# Patient Record
Sex: Male | Born: 1935 | Race: White | Hispanic: No | Marital: Married | Smoking: Never smoker
Health system: Southern US, Community
[De-identification: ages and names within clinical notes are randomized; demographics above are authoritative.]

## PROBLEM LIST (undated history)

## (undated) DIAGNOSIS — G629 Polyneuropathy, unspecified: Secondary | ICD-10-CM

## (undated) DIAGNOSIS — E559 Vitamin D deficiency, unspecified: Secondary | ICD-10-CM

## (undated) DIAGNOSIS — H8110 Benign paroxysmal vertigo, unspecified ear: Secondary | ICD-10-CM

## (undated) DIAGNOSIS — I779 Disorder of arteries and arterioles, unspecified: Secondary | ICD-10-CM

## (undated) DIAGNOSIS — I4891 Unspecified atrial fibrillation: Secondary | ICD-10-CM

## (undated) DIAGNOSIS — F329 Major depressive disorder, single episode, unspecified: Secondary | ICD-10-CM

## (undated) DIAGNOSIS — G459 Transient cerebral ischemic attack, unspecified: Secondary | ICD-10-CM

## (undated) DIAGNOSIS — F32A Depression, unspecified: Secondary | ICD-10-CM

## (undated) DIAGNOSIS — I739 Peripheral vascular disease, unspecified: Secondary | ICD-10-CM

## (undated) DIAGNOSIS — K760 Fatty (change of) liver, not elsewhere classified: Secondary | ICD-10-CM

## (undated) DIAGNOSIS — M810 Age-related osteoporosis without current pathological fracture: Secondary | ICD-10-CM

## (undated) HISTORY — DX: Major depressive disorder, single episode, unspecified: F32.9

## (undated) HISTORY — DX: Depression, unspecified: F32.A

## (undated) HISTORY — DX: Peripheral vascular disease, unspecified: I73.9

## (undated) HISTORY — DX: Polyneuropathy, unspecified: G62.9

## (undated) HISTORY — DX: Vitamin D deficiency, unspecified: E55.9

## (undated) HISTORY — DX: Age-related osteoporosis without current pathological fracture: M81.0

## (undated) HISTORY — PX: COLONOSCOPY: SHX174

## (undated) HISTORY — DX: Disorder of arteries and arterioles, unspecified: I77.9

## (undated) HISTORY — DX: Benign paroxysmal vertigo, unspecified ear: H81.10

## (undated) HISTORY — DX: Gilbert syndrome: E80.4

## (undated) HISTORY — DX: Unspecified atrial fibrillation: I48.91

## (undated) HISTORY — DX: Fatty (change of) liver, not elsewhere classified: K76.0

## (undated) SURGERY — Surgical Case
Anesthesia: *Unknown

---

## 2005-01-13 ENCOUNTER — Encounter: Admission: RE | Admit: 2005-01-13 | Discharge: 2005-01-13 | Payer: Self-pay | Admitting: Otolaryngology

## 2006-01-22 ENCOUNTER — Encounter: Admission: RE | Admit: 2006-01-22 | Discharge: 2006-01-22 | Payer: Self-pay | Admitting: Geriatric Medicine

## 2006-09-19 ENCOUNTER — Encounter: Payer: Self-pay | Admitting: Unknown Physician Specialty

## 2006-09-22 ENCOUNTER — Encounter: Payer: Self-pay | Admitting: Unknown Physician Specialty

## 2006-10-22 ENCOUNTER — Encounter: Payer: Self-pay | Admitting: Unknown Physician Specialty

## 2007-01-22 ENCOUNTER — Encounter: Admission: RE | Admit: 2007-01-22 | Discharge: 2007-01-22 | Payer: Self-pay | Admitting: Geriatric Medicine

## 2008-07-13 ENCOUNTER — Encounter: Admission: RE | Admit: 2008-07-13 | Discharge: 2008-07-13 | Payer: Self-pay | Admitting: Geriatric Medicine

## 2008-07-20 ENCOUNTER — Encounter: Admission: RE | Admit: 2008-07-20 | Discharge: 2008-07-20 | Payer: Self-pay | Admitting: Geriatric Medicine

## 2010-05-14 ENCOUNTER — Encounter: Payer: Self-pay | Admitting: Gastroenterology

## 2010-05-18 ENCOUNTER — Encounter
Admission: RE | Admit: 2010-05-18 | Discharge: 2010-05-18 | Payer: Self-pay | Source: Home / Self Care | Attending: Geriatric Medicine | Admitting: Geriatric Medicine

## 2010-09-19 ENCOUNTER — Emergency Department (HOSPITAL_COMMUNITY): Payer: Medicare Other

## 2010-09-19 ENCOUNTER — Emergency Department (HOSPITAL_COMMUNITY)
Admission: EM | Admit: 2010-09-19 | Discharge: 2010-09-19 | Disposition: A | Discharge status: Home / Self Care | Payer: Medicare Other | Attending: Emergency Medicine | Admitting: Emergency Medicine

## 2010-09-19 DIAGNOSIS — S0990XA Unspecified injury of head, initial encounter: Secondary | ICD-10-CM | POA: Insufficient documentation

## 2010-09-19 DIAGNOSIS — W19XXXA Unspecified fall, initial encounter: Secondary | ICD-10-CM | POA: Insufficient documentation

## 2010-09-19 DIAGNOSIS — Z7901 Long term (current) use of anticoagulants: Secondary | ICD-10-CM | POA: Insufficient documentation

## 2010-09-19 DIAGNOSIS — I6789 Other cerebrovascular disease: Secondary | ICD-10-CM | POA: Insufficient documentation

## 2010-09-19 DIAGNOSIS — IMO0002 Reserved for concepts with insufficient information to code with codable children: Secondary | ICD-10-CM | POA: Insufficient documentation

## 2010-09-19 DIAGNOSIS — I4891 Unspecified atrial fibrillation: Secondary | ICD-10-CM | POA: Insufficient documentation

## 2013-01-18 ENCOUNTER — Emergency Department (HOSPITAL_COMMUNITY)
Admission: EM | Admit: 2013-01-18 | Discharge: 2013-01-18 | Disposition: A | Discharge status: Home / Self Care | Payer: Medicare Other | Attending: Emergency Medicine | Admitting: Emergency Medicine

## 2013-01-18 DIAGNOSIS — R040 Epistaxis: Secondary | ICD-10-CM | POA: Insufficient documentation

## 2013-01-18 DIAGNOSIS — Z880 Allergy status to penicillin: Secondary | ICD-10-CM | POA: Insufficient documentation

## 2013-01-18 DIAGNOSIS — Z79899 Other long term (current) drug therapy: Secondary | ICD-10-CM | POA: Insufficient documentation

## 2013-01-18 DIAGNOSIS — Z7901 Long term (current) use of anticoagulants: Secondary | ICD-10-CM | POA: Insufficient documentation

## 2013-01-18 LAB — PROTIME-INR: INR: 2.67 — ABNORMAL HIGH (ref 0.00–1.49)

## 2013-01-18 MED ORDER — OXYMETAZOLINE HCL 0.05 % NA SOLN
1.0000 | Freq: Once | NASAL | Status: AC
  Administered 2013-01-18: 1 via NASAL
  Filled 2013-01-18: qty 15

## 2013-01-18 NOTE — ED Notes (Signed)
Nose bleed started at 640 pm  Pt is on coumadin,  Now coming out of mouth,  Blood pressure 200/100 initially

## 2013-01-18 NOTE — ED Notes (Signed)
Bed: ZO10 Expected date: 01/18/13 Expected time: 7:36 PM Means of arrival: Ambulance Comments: Bed 24, EMS, 32 M, Epistaxis

## 2013-01-18 NOTE — ED Notes (Signed)
Dr. Plunkett at bedside.  

## 2013-01-18 NOTE — ED Provider Notes (Addendum)
CSN: [id number]     Arrival date & time 01/18/13  1944 History   First MD Initiated Contact with Patient 01/18/13 2004     Chief Complaint  Patient presents with  . Epistaxis   (Consider location/radiation/quality/duration/timing/severity/associated sxs/prior Treatment) Patient is a 79 y.o. male presenting with nosebleeds. The history is provided by the patient.  Epistaxis Location:  L nare Severity:  Severe Duration:  2 hours Timing:  Constant Progression:  Unchanged Chronicity:  New Context: anticoagulants   Context: not foreign body, not nose picking, not recent infection and not trauma   Relieved by:  Nothing Worsened by:  Nothing tried Ineffective treatments:  Applying pressure Associated symptoms: blood in oropharynx   Associated symptoms: no cough, no dizziness, no fever and no headaches   Risk factors: no change in medication and no frequent nosebleeds     No past medical history on file. No past surgical history on file. No family history on file. History  Substance Use Topics  . Smoking status: Not on file  . Smokeless tobacco: Not on file  . Alcohol Use: Not on file    Review of Systems  Constitutional: Negative for fever.  HENT: Positive for nosebleeds.   Respiratory: Negative for cough.   Neurological: Negative for dizziness and headaches.  All other systems reviewed and are negative.    Allergies  Penicillin g benzathine; Scopolamine base; and Sulfa antibiotics  Home Medications   Current Outpatient Rx  Name  Route  Sig  Dispense  Refill  . cholecalciferol (VITAMIN D) 1000 UNITS tablet   Oral   Take 1,000 Units by mouth daily.         Marland Kitchen co-enzyme Q-10 50 MG capsule   Oral   Take 100 mg by mouth daily.         Marland Kitchen donepezil (ARICEPT) 10 MG tablet   Oral   Take 10 mg by mouth at bedtime.         . dutasteride (AVODART) 0.5 MG capsule   Oral   Take 0.5 mg by mouth at bedtime.          . fish oil-omega-3 fatty acids 1000 MG capsule   Oral   Take 1 g by mouth 2 (two) times daily.         Marland Kitchen L-Lysine 500 MG TABS   Oral   Take 500 mg by mouth daily.         . memantine (NAMENDA) 10 MG tablet   Oral   Take 10 mg by mouth 2 (two) times daily.         . Multiple Vitamin (MULTIVITAMIN WITH MINERALS) TABS tablet   Oral   Take 1 tablet by mouth daily.         . sertraline (ZOLOFT) 25 MG tablet   Oral   Take 25 mg by mouth daily.         . sildenafil (VIAGRA) 50 MG tablet   Oral   Take 50 mg by mouth daily as needed for erectile dysfunction.         . tamsulosin (FLOMAX) 0.4 MG CAPS capsule   Oral   Take 0.4 mg by mouth at bedtime.         Marland Kitchen warfarin (COUMADIN) 3 MG tablet   Oral   Take 3 mg by mouth every evening.          BP 173/60  Pulse 64  Resp 15  SpO2 100% Physical Exam  Nursing note and  vitals reviewed. Constitutional: He is oriented to person, place, and time. He appears well-developed and well-nourished. No distress.  HENT:  Head: Normocephalic and atraumatic.  Nose: Epistaxis is observed.  Active bleeding out of the left nare.  Blood in the posterior pharynx  Eyes: EOM are normal. Pupils are equal, round, and reactive to light.  Cardiovascular: Normal rate, regular rhythm and intact distal pulses.   Pulmonary/Chest: Effort normal.  Neurological: He is alert and oriented to person, place, and time.  Skin: Skin is warm and dry.  Psychiatric: He has a normal mood and affect. His behavior is normal.    ED Course  EPISTAXIS MANAGEMENT Date/Time: 01/18/2013 8:24 PM Performed by: Gwyneth Sprout Authorized by: Gwyneth Sprout Consent: Verbal consent obtained. Consent given by: patient and spouse Patient identity confirmed: verbally with patient Patient sedated: no Treatment site: left anterior Repair method: suction and anterior pack Post-procedure assessment: bleeding stopped Treatment complexity: simple Patient tolerance: Patient tolerated the procedure well with no  immediate complications. Comments: Bleeding resolved.   (including critical care time) Labs Review Labs Reviewed  PROTIME-INR - Abnormal; Notable for the following:    Prothrombin Time 27.5 (*)    INR 2.67 (*)    All other components within normal limits   Imaging Review No results found.  MDM   1. Left-sided epistaxis     Patient presenting with epistaxis out of the left near. Appears to be an anterior nosebleed. Coumadin was last checked several weeks ago and was okay.  Patient had anterior packing placed with resolution of the nosebleed. He is otherwise well-appearing and INR is pending.  8:52 PM INR is therapeutic and will d/c home with packing nad f/u in 3 days.  Gwyneth Sprout, MD 01/18/13 1610  Gwyneth Sprout, MD 01/18/13 [phone]

## 2013-07-30 ENCOUNTER — Ambulatory Visit
Admission: RE | Admit: 2013-07-30 | Discharge: 2013-07-30 | Disposition: A | Discharge status: Home / Self Care | Payer: Medicare Other | Source: Ambulatory Visit | Attending: Geriatric Medicine | Admitting: Geriatric Medicine

## 2013-07-30 ENCOUNTER — Other Ambulatory Visit: Payer: Self-pay | Admitting: Geriatric Medicine

## 2013-07-30 DIAGNOSIS — R062 Wheezing: Secondary | ICD-10-CM

## 2013-08-24 ENCOUNTER — Other Ambulatory Visit: Payer: Self-pay | Admitting: Geriatric Medicine

## 2013-08-24 ENCOUNTER — Ambulatory Visit
Admission: RE | Admit: 2013-08-24 | Discharge: 2013-08-24 | Disposition: A | Discharge status: Home / Self Care | Payer: Medicare Other | Source: Ambulatory Visit | Attending: Geriatric Medicine | Admitting: Geriatric Medicine

## 2013-08-24 DIAGNOSIS — J90 Pleural effusion, not elsewhere classified: Secondary | ICD-10-CM

## 2013-11-18 ENCOUNTER — Encounter: Payer: Self-pay | Admitting: *Deleted

## 2013-12-25 ENCOUNTER — Ambulatory Visit
Admission: RE | Admit: 2013-12-25 | Discharge: 2013-12-25 | Disposition: A | Discharge status: Home / Self Care | Payer: Medicare Other | Source: Ambulatory Visit | Attending: Geriatric Medicine | Admitting: Geriatric Medicine

## 2013-12-25 ENCOUNTER — Other Ambulatory Visit: Payer: Self-pay | Admitting: Geriatric Medicine

## 2013-12-25 DIAGNOSIS — R059 Cough, unspecified: Secondary | ICD-10-CM

## 2013-12-25 DIAGNOSIS — R05 Cough: Secondary | ICD-10-CM

## 2014-01-20 ENCOUNTER — Other Ambulatory Visit: Payer: Self-pay | Admitting: Geriatric Medicine

## 2014-01-20 ENCOUNTER — Ambulatory Visit
Admission: RE | Admit: 2014-01-20 | Discharge: 2014-01-20 | Disposition: A | Discharge status: Home / Self Care | Payer: Medicare Other | Source: Ambulatory Visit | Attending: Geriatric Medicine | Admitting: Geriatric Medicine

## 2014-01-20 DIAGNOSIS — R059 Cough, unspecified: Secondary | ICD-10-CM

## 2014-01-20 DIAGNOSIS — R05 Cough: Secondary | ICD-10-CM

## 2014-08-11 ENCOUNTER — Emergency Department (HOSPITAL_COMMUNITY): Payer: Medicare Other

## 2014-08-11 ENCOUNTER — Inpatient Hospital Stay (HOSPITAL_COMMUNITY)
Admission: EM | Admit: 2014-08-11 | Discharge: 2014-08-16 | DRG: 178 | Disposition: A | Discharge status: Skilled Nursing Facility | Payer: Medicare Other | Attending: Internal Medicine | Admitting: Internal Medicine

## 2014-08-11 ENCOUNTER — Encounter (HOSPITAL_COMMUNITY): Payer: Self-pay | Admitting: Emergency Medicine

## 2014-08-11 DIAGNOSIS — I482 Chronic atrial fibrillation: Secondary | ICD-10-CM | POA: Diagnosis not present

## 2014-08-11 DIAGNOSIS — Z79899 Other long term (current) drug therapy: Secondary | ICD-10-CM | POA: Diagnosis not present

## 2014-08-11 DIAGNOSIS — I4891 Unspecified atrial fibrillation: Secondary | ICD-10-CM | POA: Diagnosis present

## 2014-08-11 DIAGNOSIS — I272 Other secondary pulmonary hypertension: Secondary | ICD-10-CM | POA: Diagnosis present

## 2014-08-11 DIAGNOSIS — R0609 Other forms of dyspnea: Secondary | ICD-10-CM | POA: Diagnosis present

## 2014-08-11 DIAGNOSIS — I739 Peripheral vascular disease, unspecified: Secondary | ICD-10-CM

## 2014-08-11 DIAGNOSIS — J90 Pleural effusion, not elsewhere classified: Secondary | ICD-10-CM | POA: Diagnosis present

## 2014-08-11 DIAGNOSIS — J189 Pneumonia, unspecified organism: Secondary | ICD-10-CM | POA: Diagnosis present

## 2014-08-11 DIAGNOSIS — R791 Abnormal coagulation profile: Secondary | ICD-10-CM | POA: Diagnosis not present

## 2014-08-11 DIAGNOSIS — J690 Pneumonitis due to inhalation of food and vomit: Principal | ICD-10-CM | POA: Diagnosis present

## 2014-08-11 DIAGNOSIS — E559 Vitamin D deficiency, unspecified: Secondary | ICD-10-CM | POA: Diagnosis present

## 2014-08-11 DIAGNOSIS — F0390 Unspecified dementia without behavioral disturbance: Secondary | ICD-10-CM | POA: Diagnosis present

## 2014-08-11 DIAGNOSIS — Z7901 Long term (current) use of anticoagulants: Secondary | ICD-10-CM

## 2014-08-11 DIAGNOSIS — I10 Essential (primary) hypertension: Secondary | ICD-10-CM | POA: Diagnosis present

## 2014-08-11 DIAGNOSIS — K760 Fatty (change of) liver, not elsewhere classified: Secondary | ICD-10-CM | POA: Diagnosis present

## 2014-08-11 DIAGNOSIS — Z8673 Personal history of transient ischemic attack (TIA), and cerebral infarction without residual deficits: Secondary | ICD-10-CM

## 2014-08-11 DIAGNOSIS — Z66 Do not resuscitate: Secondary | ICD-10-CM | POA: Diagnosis present

## 2014-08-11 DIAGNOSIS — G459 Transient cerebral ischemic attack, unspecified: Secondary | ICD-10-CM

## 2014-08-11 DIAGNOSIS — R748 Abnormal levels of other serum enzymes: Secondary | ICD-10-CM

## 2014-08-11 DIAGNOSIS — I509 Heart failure, unspecified: Secondary | ICD-10-CM | POA: Diagnosis present

## 2014-08-11 DIAGNOSIS — F329 Major depressive disorder, single episode, unspecified: Secondary | ICD-10-CM | POA: Diagnosis present

## 2014-08-11 DIAGNOSIS — Z880 Allergy status to penicillin: Secondary | ICD-10-CM | POA: Diagnosis not present

## 2014-08-11 DIAGNOSIS — R1310 Dysphagia, unspecified: Secondary | ICD-10-CM | POA: Diagnosis present

## 2014-08-11 DIAGNOSIS — H8110 Benign paroxysmal vertigo, unspecified ear: Secondary | ICD-10-CM | POA: Diagnosis present

## 2014-08-11 DIAGNOSIS — R7989 Other specified abnormal findings of blood chemistry: Secondary | ICD-10-CM

## 2014-08-11 DIAGNOSIS — Z9889 Other specified postprocedural states: Secondary | ICD-10-CM

## 2014-08-11 DIAGNOSIS — I779 Disorder of arteries and arterioles, unspecified: Secondary | ICD-10-CM | POA: Diagnosis present

## 2014-08-11 DIAGNOSIS — G629 Polyneuropathy, unspecified: Secondary | ICD-10-CM | POA: Diagnosis present

## 2014-08-11 DIAGNOSIS — R0602 Shortness of breath: Secondary | ICD-10-CM | POA: Diagnosis present

## 2014-08-11 HISTORY — DX: Transient cerebral ischemic attack, unspecified: G45.9

## 2014-08-11 LAB — CBC WITH DIFFERENTIAL/PLATELET
BASOS ABS: 0 10*3/uL (ref 0.0–0.1)
BASOS PCT: 0 % (ref 0–1)
EOS PCT: 3 % (ref 0–5)
Eosinophils Absolute: 0.2 10*3/uL (ref 0.0–0.7)
HEMATOCRIT: 38.1 % — AB (ref 39.0–52.0)
HEMOGLOBIN: 12.5 g/dL — AB (ref 13.0–17.0)
Lymphocytes Relative: 11 % — ABNORMAL LOW (ref 12–46)
Lymphs Abs: 0.8 10*3/uL (ref 0.7–4.0)
MCH: 32.9 pg (ref 26.0–34.0)
MCHC: 32.8 g/dL (ref 30.0–36.0)
MCV: 100.3 fL — AB (ref 78.0–100.0)
MONO ABS: 0.6 10*3/uL (ref 0.1–1.0)
Monocytes Relative: 10 % (ref 3–12)
Neutro Abs: 5.1 10*3/uL (ref 1.7–7.7)
Neutrophils Relative %: 76 % (ref 43–77)
Platelets: 243 10*3/uL (ref 150–400)
RBC: 3.8 MIL/uL — ABNORMAL LOW (ref 4.22–5.81)
RDW: 14 % (ref 11.5–15.5)
WBC: 6.7 10*3/uL (ref 4.0–10.5)

## 2014-08-11 LAB — URINALYSIS, ROUTINE W REFLEX MICROSCOPIC
Bilirubin Urine: NEGATIVE
Bilirubin Urine: NEGATIVE
GLUCOSE, UA: NEGATIVE mg/dL
Glucose, UA: NEGATIVE mg/dL
KETONES UR: NEGATIVE mg/dL
Ketones, ur: NEGATIVE mg/dL
LEUKOCYTES UA: NEGATIVE
Leukocytes, UA: NEGATIVE
Nitrite: NEGATIVE
Nitrite: NEGATIVE
PH: 7 (ref 5.0–8.0)
PH: 7.5 (ref 5.0–8.0)
PROTEIN: NEGATIVE mg/dL
Protein, ur: NEGATIVE mg/dL
SPECIFIC GRAVITY, URINE: 1.008 (ref 1.005–1.030)
Specific Gravity, Urine: 1.014 (ref 1.005–1.030)
Urobilinogen, UA: 1 mg/dL (ref 0.0–1.0)
Urobilinogen, UA: 1 mg/dL (ref 0.0–1.0)

## 2014-08-11 LAB — URINE MICROSCOPIC-ADD ON

## 2014-08-11 LAB — COMPREHENSIVE METABOLIC PANEL
ALT: 24 U/L (ref 0–53)
AST: 28 U/L (ref 0–37)
Albumin: 2.8 g/dL — ABNORMAL LOW (ref 3.5–5.2)
Alkaline Phosphatase: 125 U/L — ABNORMAL HIGH (ref 39–117)
Anion gap: 7 (ref 5–15)
BUN: 27 mg/dL — ABNORMAL HIGH (ref 6–23)
CO2: 27 mmol/L (ref 19–32)
Calcium: 8.5 mg/dL (ref 8.4–10.5)
Chloride: 104 mmol/L (ref 96–112)
Creatinine, Ser: 1.43 mg/dL — ABNORMAL HIGH (ref 0.50–1.35)
GFR calc non Af Amer: 42 mL/min — ABNORMAL LOW (ref >90)
GFR, EST AFRICAN AMERICAN: 49 mL/min — AB (ref >90)
Glucose, Bld: 100 mg/dL — ABNORMAL HIGH (ref 70–99)
Potassium: 3.9 mmol/L (ref 3.5–5.1)
Sodium: 138 mmol/L (ref 135–145)
Total Bilirubin: 1.2 mg/dL (ref 0.3–1.2)
Total Protein: 7.2 g/dL (ref 6.0–8.3)

## 2014-08-11 LAB — PROTIME-INR
INR: 4.29 — ABNORMAL HIGH (ref 0.00–1.49)
PROTHROMBIN TIME: 41.5 s — AB (ref 11.6–15.2)

## 2014-08-11 LAB — I-STAT TROPONIN, ED: TROPONIN I, POC: 0.01 ng/mL (ref 0.00–0.08)

## 2014-08-11 LAB — BRAIN NATRIURETIC PEPTIDE: B NATRIURETIC PEPTIDE 5: 320.9 pg/mL — AB (ref 0.0–100.0)

## 2014-08-11 MED ORDER — DUTASTERIDE 0.5 MG PO CAPS
0.5000 mg | ORAL_CAPSULE | Freq: Every day | ORAL | Status: DC
  Administered 2014-08-11 – 2014-08-15 (×5): 0.5 mg via ORAL
  Filled 2014-08-11 (×6): qty 1

## 2014-08-11 MED ORDER — ACETAMINOPHEN 325 MG PO TABS: 650.0000 mg | ORAL_TABLET | Freq: Four times a day (QID) | ORAL | Status: DC | PRN

## 2014-08-11 MED ORDER — DOCUSATE SODIUM 50 MG/5ML PO LIQD
100.0000 mg | Freq: Two times a day (BID) | ORAL | Status: DC
  Administered 2014-08-11: 100 mg via ORAL
  Filled 2014-08-11 (×3): qty 10

## 2014-08-11 MED ORDER — SERTRALINE HCL 25 MG PO TABS
25.0000 mg | ORAL_TABLET | Freq: Every day | ORAL | Status: DC
  Administered 2014-08-12 – 2014-08-16 (×5): 25 mg via ORAL
  Filled 2014-08-11 (×5): qty 1

## 2014-08-11 MED ORDER — DONEPEZIL HCL 10 MG PO TABS
10.0000 mg | ORAL_TABLET | Freq: Every day | ORAL | Status: DC
  Administered 2014-08-11 – 2014-08-15 (×5): 10 mg via ORAL
  Filled 2014-08-11 (×6): qty 1

## 2014-08-11 MED ORDER — TAMSULOSIN HCL 0.4 MG PO CAPS
0.4000 mg | ORAL_CAPSULE | Freq: Every day | ORAL | Status: DC
  Administered 2014-08-11 – 2014-08-15 (×5): 0.4 mg via ORAL
  Filled 2014-08-11 (×6): qty 1

## 2014-08-11 MED ORDER — SODIUM CHLORIDE 0.9 % IV SOLN
INTRAVENOUS | Status: DC
  Administered 2014-08-11: 17:00:00 via INTRAVENOUS

## 2014-08-11 MED ORDER — FUROSEMIDE 40 MG PO TABS
40.0000 mg | ORAL_TABLET | Freq: Every day | ORAL | Status: DC
  Administered 2014-08-12 – 2014-08-16 (×5): 40 mg via ORAL
  Filled 2014-08-11 (×5): qty 1

## 2014-08-11 MED ORDER — SODIUM CHLORIDE 0.9 % IV SOLN
INTRAVENOUS | Status: AC
  Administered 2014-08-11: 17:00:00 via INTRAVENOUS
  Filled 2014-08-11: qty 1000

## 2014-08-11 MED ORDER — ENSURE ENLIVE PO LIQD
237.0000 mL | Freq: Two times a day (BID) | ORAL | Status: DC
Start: 2014-08-12 — End: 2014-08-16
  Administered 2014-08-12 – 2014-08-16 (×8): 237 mL via ORAL

## 2014-08-11 MED ORDER — MEMANTINE HCL 10 MG PO TABS
10.0000 mg | ORAL_TABLET | Freq: Two times a day (BID) | ORAL | Status: DC
  Administered 2014-08-11 – 2014-08-16 (×10): 10 mg via ORAL
  Filled 2014-08-11 (×11): qty 1

## 2014-08-11 MED ORDER — ONDANSETRON HCL 4 MG/2ML IJ SOLN: 4.0000 mg | Freq: Four times a day (QID) | INTRAMUSCULAR | Status: DC | PRN

## 2014-08-11 MED ORDER — SODIUM CHLORIDE 0.9 % IJ SOLN
3.0000 mL | Freq: Two times a day (BID) | INTRAMUSCULAR | Status: DC
  Administered 2014-08-12 – 2014-08-16 (×8): 3 mL via INTRAVENOUS

## 2014-08-11 NOTE — Progress Notes (Addendum)
Dinah BeersZeb N Bertucci [id number] Admission Data: 08/11/2014 4:50 PM Attending Provider: Marinda ElkAbraham Feliz Ortiz, MD  WUJ:[id number],NFAPCP:STONEKING,HAL Maisie FusHOMAS, MD Consults/ Treatment Team:    Dinah BeersZeb N Francis Thomas is a 81 y.o. male patient admitted from ED awake, alert  & orientated  To self,  DNR, VSS - Blood pressure 129/83, pulse 63, temperature 98.3 F (36.8 C), temperature source Oral, resp. rate 18, weight 66.769 kg (147 lb 3.2 oz), SpO2 99 %., no c/o chest pain, no distress noted. Tele #8placed and pt is currently running:   IV site WDL: Left arm, SL.  Allergies:   Allergies  Allergen Reactions  . Penicillin G Benzathine   . Scopolamine Base [Scopolamine]   . Skelaxin [Metaxalone]     Confusion  . Sulfa Antibiotics      Past Medical History  Diagnosis Date  . Atrial fibrillation   . Osteoporosis   . Benign positional vertigo   . Fatty liver   . Vitamin D deficiency   . Peripheral neuropathy   . Bilateral carotid artery disease   . Gilbert's syndrome     Mild  . Depression   . TIA (transient ischemic attack)      Pt orientation to unit, room and routine. Information packet given to patient/family and safety video watched.  Admission INP armband ID verified with patient/family, and in place. SR up x 2, fall risk assessment complete with Patient and family verbalizing understanding of risks associated with falls. Pt verbalizes an understanding of how to use the call bell and to call for help before getting out of bed.  Skin, tiny stage two noted to Right hip, scab noted to ankle area of right foot, abrasion/scab noted to Left shin. Scab on right ankle and stage II to hip covered with a foam dsg.     Will cont to monitor and assist as needed.  Kern ReapBrumagin, Tia Hieronymus L, RN 08/11/2014 4:50 PM

## 2014-08-11 NOTE — Progress Notes (Signed)
ANTICOAGULATION CONSULT NOTE - Initial Consult  Pharmacy Consult for warfarin Indication: atrial fibrillation  Allergies  Allergen Reactions  . Penicillin G Benzathine   . Scopolamine Base [Scopolamine]   . Skelaxin [Metaxalone]     Confusion  . Sulfa Antibiotics     Patient Measurements: Weight: 147 lb 3.2 oz (66.769 kg)  Vital Signs: Temp: 98.6 F (37 C) (04/20 1008) Temp Source: Oral (04/20 1008) BP: 162/65 mmHg (04/20 1515) Pulse Rate: 53 (04/20 1515)  Labs:  Recent Labs  08/11/14 1035  HGB 12.5*  HCT 38.1*  PLT 243  LABPROT 41.5*  INR 4.29*  CREATININE 1.43*    CrCl cannot be calculated (Unknown ideal weight.).   Medical History: Past Medical History  Diagnosis Date  . Atrial fibrillation   . Osteoporosis   . Benign positional vertigo   . Fatty liver   . Vitamin D deficiency   . Peripheral neuropathy   . Bilateral carotid artery disease   . Gilbert's syndrome     Mild  . Depression     Medications:  Prescriptions prior to admission  Medication Sig Dispense Refill Last Dose  . CALCIUM PO Take 1 tablet by mouth daily.   08/10/2014 at Unknown time  . cholecalciferol (VITAMIN D) 1000 UNITS tablet Take 1,000 Units by mouth daily.   08/10/2014 at Unknown time  . co-enzyme Q-10 50 MG capsule Take 100 mg by mouth daily.   08/10/2014 at Unknown time  . donepezil (ARICEPT) 10 MG tablet Take 10 mg by mouth at bedtime.   08/10/2014 at Unknown time  . dutasteride (AVODART) 0.5 MG capsule Take 0.5 mg by mouth at bedtime.    08/10/2014 at Unknown time  . furosemide (LASIX) 40 MG tablet Take 40 mg by mouth every morning.   08/11/2014 at Unknown time  . IRON PO Take 1 tablet by mouth daily.   08/10/2014 at Unknown time  . memantine (NAMENDA) 10 MG tablet Take 10 mg by mouth 2 (two) times daily.   08/10/2014 at Unknown time  . OVER THE COUNTER MEDICATION Place 1 application into both nostrils 3 (three) times daily as needed (nasal dripping). OTC nasal cream for nasal  dripping   Past Week at Unknown time  . sertraline (ZOLOFT) 25 MG tablet Take 25 mg by mouth daily.   08/11/2014 at Unknown time  . tamsulosin (FLOMAX) 0.4 MG CAPS capsule Take 0.4 mg by mouth at bedtime.   08/10/2014 at Unknown time  . warfarin (COUMADIN) 3 MG tablet Take 3 mg by mouth every evening.   08/10/2014    Assessment: 81 year old man on chronic warfarin for AFIB to continue warfarin while hospitalized.  INR today 4.29 which is above his goal of 2-3.  May need a procedure to drain a pleural effusion at some point. Goal of Therapy:  INR 2-3    Plan:  Hold warfarin  Daily protimes  Mickeal SkinnerFrens, Teairra Millar John 08/11/2014,4:26 PM

## 2014-08-11 NOTE — ED Notes (Signed)
MD at bedside. 

## 2014-08-11 NOTE — H&P (Signed)
[facility] History and Physical                                                                                    Francis Thomas, is a 81 y.o. male  MRN: [id number]   DOB - [birth year]  Admit Date - 08/11/2014  Outpatient Primary MD for the patient is Ginette Otto, MD  Referring Physician:  Dr. Deretha Emory, EDP  Chief Complaint:   Chief Complaint  Patient presents with  . Shortness of Breath     HPI  Francis Thomas  is a 81 y.o. male, with a past medical history significant for atrial fibrillation on chronic Coumadin, benign positional vertigo, bilateral carotid artery disease, and mild dementia. He presented to the emergency department with shortness of breath that has been become significantly worse over the last 24 hours. Mr. Matassa has been followed for a large right pleural effusion for at least the last 6 months. However, on presentation today x-ray shows significant enlargement. His wife reports that he is short of breath with any exertion and is unable to walk up stairs at home. This is abnormal for him as he is normally able angulate without difficulty. He does have a slight nonproductive cough that started in the last day or 2. His wife assumed his allergies. The patient denies any chest pain or palpitations. He has been afebrile. Approximately 2 weeks ago he had difficulty walking and a fall. He was seen by Dr. Merlene Laughter who felt he most likely had a TIA or small stroke.  In the emergency department chest x-ray shows a significantly enlarged right pleural effusion with deviated trachea and a question of an underlying mass. The patient's creatinine is mildly elevated at 1.43, BUN is 27. INR is 4.29.  Review of Systems   In addition to the HPI above,  No Fever-chills, No Headache, No changes with Vision or hearing, ++ Patient's wife describes that the patient has had difficulty swallowing lately. No Chest pain, orthopnea, PND No Abdominal pain, No Nausea or Vomiting, Bowel  movements are constipated No Blood in stool or Urine, + he is normally incontinent of urine, wife describes a strong odor to his urine and  No dysuria, No new skin rashes or bruises, No new joints pains-aches,  No new weakness, tingling, numbness in any extremity, No recent weight gain or loss, A full 10 point Review of Systems was done, except as stated above, all other Review of Systems were negative.  Past Medical History  Past Medical History  Diagnosis Date  . Atrial fibrillation   . Osteoporosis   . Benign positional vertigo   . Fatty liver   . Vitamin D deficiency   . Peripheral neuropathy   . Bilateral carotid artery disease   . Gilbert's syndrome     Mild  . Depression     History reviewed. No pertinent past surgical history.    Social History History  Substance Use Topics  . Smoking status: Never Smoker   . Smokeless tobacco: Not on file  . Alcohol Use: Yes     Comment: Occasional Glass of wine or beer    Family History No known family  history of lung cancer.  Mother died of heart disease.  Father died of an MI.  Prior to Admission medications   Medication Sig Start Date End Date Taking? Authorizing Provider  CALCIUM PO Take 1 tablet by mouth daily.   Yes Historical Provider, MD  cholecalciferol (VITAMIN D) 1000 UNITS tablet Take 1,000 Units by mouth daily.   Yes Historical Provider, MD  co-enzyme Q-10 50 MG capsule Take 100 mg by mouth daily.   Yes Historical Provider, MD  donepezil (ARICEPT) 10 MG tablet Take 10 mg by mouth at bedtime.   Yes Historical Provider, MD  dutasteride (AVODART) 0.5 MG capsule Take 0.5 mg by mouth at bedtime.    Yes Historical Provider, MD  furosemide (LASIX) 40 MG tablet Take 40 mg by mouth every morning.   Yes Historical Provider, MD  IRON PO Take 1 tablet by mouth daily.   Yes Historical Provider, MD  memantine (NAMENDA) 10 MG tablet Take 10 mg by mouth 2 (two) times daily.   Yes Historical Provider, MD  OVER THE COUNTER  MEDICATION Place 1 application into both nostrils 3 (three) times daily as needed (nasal dripping). OTC nasal cream for nasal dripping   Yes Historical Provider, MD  sertraline (ZOLOFT) 25 MG tablet Take 25 mg by mouth daily.   Yes Historical Provider, MD  tamsulosin (FLOMAX) 0.4 MG CAPS capsule Take 0.4 mg by mouth at bedtime.   Yes Historical Provider, MD  warfarin (COUMADIN) 3 MG tablet Take 3 mg by mouth every evening.   Yes Historical Provider, MD    Allergies  Allergen Reactions  . Penicillin G Benzathine   . Scopolamine Base [Scopolamine]   . Skelaxin [Metaxalone]     Confusion  . Sulfa Antibiotics     Physical Exam  Vitals  Blood pressure 162/65, pulse 53, temperature 98.6 F (37 C), temperature source Oral, resp. rate 14, weight 66.769 kg (147 lb 3.2 oz), SpO2 96 %.   General:  Wd, thin elderly male, pleasantly lying in bed in NAD, wife at bedside.  Psych:  Normal affect and insight, Not Suicidal or Homicidal, Awake, Alert, pleasantly demented.  Unable to give an accurate history.  Neuro:   Tongue deviates right, Strength 5/5 all 4 extremities, Sensation intact all 4 extremities.  ENT:  Ears and Eyes appear Normal, Conjunctivae clear, PER. Moist oral mucosa without erythema or exudates.  Neck:  Supple, No lymphadenopathy appreciated  Respiratory:  Poor air movement on the right.  No frank w/c/r  Cardiac:  irreg irreg, No Murmurs, no LE edema noted, no JVD.    Abdomen:  Positive bowel sounds, Soft, Non tender, Non distended,  No masses appreciated  Skin:  No Cyanosis, Normal Skin Turgor, No Skin Rash or Bruise.  Extremities:  Able to move all 4.   no effusions.  Data Review  CBC  Recent Labs Lab 08/11/14 1035  WBC 6.7  HGB 12.5*  HCT 38.1*  PLT 243  MCV 100.3*  MCH 32.9  MCHC 32.8  RDW 14.0  LYMPHSABS 0.8  MONOABS 0.6  EOSABS 0.2  BASOSABS 0.0    Chemistries   Recent Labs Lab 08/11/14 1035  NA 138  K 3.9  CL 104  CO2 27  GLUCOSE 100*   BUN 27*  CREATININE 1.43*  CALCIUM 8.5  AST 28  ALT 24  ALKPHOS 125*  BILITOT 1.2    Coagulation profile  Recent Labs Lab 08/11/14 1035  INR 4.29*    Urinalysis    Component  Value Date/Time   COLORURINE YELLOW 08/11/2014 1154   APPEARANCEUR CLEAR 08/11/2014 1154   LABSPEC 1.008 08/11/2014 1154   PHURINE 7.0 08/11/2014 1154   GLUCOSEU NEGATIVE 08/11/2014 1154   HGBUR TRACE* 08/11/2014 1154   BILIRUBINUR NEGATIVE 08/11/2014 1154   KETONESUR NEGATIVE 08/11/2014 1154   PROTEINUR NEGATIVE 08/11/2014 1154   UROBILINOGEN 1.0 08/11/2014 1154   NITRITE NEGATIVE 08/11/2014 1154   LEUKOCYTESUR NEGATIVE 08/11/2014 1154    Imaging results:   [name]  08/11/2014   CLINICAL DATA:  Progressive shortness of breath.  EXAM: PORTABLE CHEST - 1 VIEW  COMPARISON:  01/20/2014  FINDINGS: There is a large right pleural effusion, increased since the prior exam. There is fullness in the right hilum which could represent atelectasis but the possibility of a mass should be considered.  Minimal atelectasis at the left lung base. Pulmonary vascularity is normal.  No acute osseous abnormality.  IMPRESSION: Marked increase in the right pleural effusion. Fullness on the right hilum may be due to atelectasis or fluid but the possibility of a mass should be considered.   Electronically Signed   By: Francene Boyers M.D.   On: 08/11/2014 11:19    My personal review of EKG: afib.   Assessment & Plan  Principal Problem:   Pleural effusion Active Problems:   Elevated serum creatinine   Dementia   TIA (transient ischemic attack)   Bilateral carotid artery disease   Atrial fibrillation   Benign positional vertigo   Elevated INR   Dyspnea on exertion   Right sided pleural effusion Evident on Cxr beginning in May of 2016.  Has progressively become larger. Concern for malignancy given fullness in right hilum.  No smoking hx.  No family hx of lung cancer. IR consulted for thoracentesis.   Fluid will be sent for study. Please order CT Chest after thoraentesis is complete. Discussed with Pulmonology who recommends calling them if the fluid is an exudate or if the tap is non diagnostic.   Elevated serum creatinine Uncertain of baseline.  Will give IVF (very low amount) while NPO.  Then restart lasix on 4/21. Recheck creatinine in am.  Afib on coumadin with elevated INR. Rate controlled.  Hold coumadin as INR is high. Coumadin per pharmacy.    May need to hold coumadin for IR tap or bronch.  Recent TIA/CVA. 2-3 weeks ago.  While on coumadin. Was evaluated by PCP (Dr. Pete Glatter) and presumed to be a TIA.  No obvious residual.  I don't know that any imaging was done of his head. Will check a 2 D echo.  Dementia Continue Aricept and Namenda  HTN Does not appear to be on BP medications at home.   Consultants Called:  Discussed with pulmonology (but not yet consulted).  IR consulted.  Spoke with Dr. Lowella Dandy  Family Communication:   Wife at bedside.  Code Status:  DNR  Condition:  Guarded.  Potential Disposition:  Home vs. Rehab.  Wife is pragmatic but has taken good care of him at home.  LOS estimated 2-3 days.  Time spent in minutes : [address]  PA-C on 08/11/2014 at 4:19 PM Between 7am to 7pm - Pager - [phone] After 7pm go to [email or web address] - password [email or web address] And look for the night coverage person covering me after hours  [facility]

## 2014-08-11 NOTE — ED Provider Notes (Signed)
CSN: [id number]     Arrival date & time 08/11/14  1002 History   First MD Initiated Contact with Patient 08/11/14 1007     Chief Complaint  Patient presents with  . Shortness of Breath     (Consider location/radiation/quality/duration/timing/severity/associated sxs/prior Treatment) Patient is a 81 y.o. male presenting with shortness of breath. The history is provided by the patient.  Shortness of Breath Associated symptoms: no abdominal pain, no chest pain, no fever, no headaches, no rash and no vomiting    patient with exertional shortness of breath much worse in the past week. Unable to walk even 10 feet without being significant short of breath. Patient arrived on 2 L of oxygen however off oxygen at rest sats are okay but with any kind of exertion patient becomes very short of breath. No fevers no chest pain. Patient family also notified INR was elevated and to stop hold the Coumadin until further notice. Patient is on the Coumadin for atrial fib.  Past Medical History  Diagnosis Date  . Atrial fibrillation   . Osteoporosis   . Benign positional vertigo   . Fatty liver   . Vitamin D deficiency   . Peripheral neuropathy   . Bilateral carotid artery disease   . Gilbert's syndrome     Mild  . Depression    History reviewed. No pertinent past surgical history. No family history on file. History  Substance Use Topics  . Smoking status: Never Smoker   . Smokeless tobacco: Not on file  . Alcohol Use: Yes     Comment: Occasional Glass of wine or beer    Review of Systems  Constitutional: Positive for fatigue. Negative for fever.  HENT: Negative for congestion.   Eyes: Negative for redness.  Respiratory: Positive for shortness of breath.   Cardiovascular: Negative for chest pain.  Gastrointestinal: Negative for nausea, vomiting, abdominal pain and diarrhea.  Genitourinary: Negative for dysuria.  Musculoskeletal: Negative for back pain.  Skin: Negative for rash.   Neurological: Negative for headaches.  Hematological: Does not bruise/bleed easily.  Psychiatric/Behavioral: Negative for confusion.      Allergies  Penicillin g benzathine; Scopolamine base; Skelaxin; and Sulfa antibiotics  Home Medications   Prior to Admission medications   Medication Sig Start Date End Date Taking? Authorizing Provider  CALCIUM PO Take 1 tablet by mouth daily.   Yes Historical Provider, MD  cholecalciferol (VITAMIN D) 1000 UNITS tablet Take 1,000 Units by mouth daily.   Yes Historical Provider, MD  co-enzyme Q-10 50 MG capsule Take 100 mg by mouth daily.   Yes Historical Provider, MD  donepezil (ARICEPT) 10 MG tablet Take 10 mg by mouth at bedtime.   Yes Historical Provider, MD  dutasteride (AVODART) 0.5 MG capsule Take 0.5 mg by mouth at bedtime.    Yes Historical Provider, MD  furosemide (LASIX) 40 MG tablet Take 40 mg by mouth every morning.   Yes Historical Provider, MD  IRON PO Take 1 tablet by mouth daily.   Yes Historical Provider, MD  memantine (NAMENDA) 10 MG tablet Take 10 mg by mouth 2 (two) times daily.   Yes Historical Provider, MD  OVER THE COUNTER MEDICATION Place 1 application into both nostrils 3 (three) times daily as needed (nasal dripping). OTC nasal cream for nasal dripping   Yes Historical Provider, MD  sertraline (ZOLOFT) 25 MG tablet Take 25 mg by mouth daily.   Yes Historical Provider, MD  tamsulosin (FLOMAX) 0.4 MG CAPS capsule Take 0.4 mg by  mouth at bedtime.   Yes Historical Provider, MD  warfarin (COUMADIN) 3 MG tablet Take 3 mg by mouth every evening.   Yes Historical Provider, MD   BP 142/71 mmHg  Pulse 71  Temp(Src) 98.6 F (37 C) (Oral)  Resp 20  Wt 147 lb 3.2 oz (66.769 kg)  SpO2 98% Physical Exam  Constitutional: He appears well-developed and well-nourished. No distress.  HENT:  Head: Normocephalic and atraumatic.  Mouth/Throat: Oropharynx is clear and moist.  Eyes: Conjunctivae and EOM are normal. Pupils are equal,  round, and reactive to light.  Neck: Normal range of motion.  Cardiovascular: Normal rate, regular rhythm and normal heart sounds.   No murmur heard. Pulmonary/Chest: Breath sounds normal.  Abdominal: Soft. Bowel sounds are normal. There is no tenderness.  Musculoskeletal: Normal range of motion. He exhibits no edema.  Neurological: He is alert. No cranial nerve deficit. He exhibits normal muscle tone. Coordination normal.  Skin: Skin is warm. No rash noted.  Nursing note and vitals reviewed.   ED Course  Procedures (including critical care time) Labs Review Labs Reviewed  COMPREHENSIVE METABOLIC PANEL - Abnormal; Notable for the following:    Glucose, Bld 100 (*)    BUN 27 (*)    Creatinine, Ser 1.43 (*)    Albumin 2.8 (*)    Alkaline Phosphatase 125 (*)    GFR calc non Af Amer 42 (*)    GFR calc Af Amer 49 (*)    All other components within normal limits  PROTIME-INR - Abnormal; Notable for the following:    Prothrombin Time 41.5 (*)    INR 4.29 (*)    All other components within normal limits  CBC WITH DIFFERENTIAL/PLATELET - Abnormal; Notable for the following:    RBC 3.80 (*)    Hemoglobin 12.5 (*)    HCT 38.1 (*)    MCV 100.3 (*)    Lymphocytes Relative 11 (*)    All other components within normal limits  BRAIN NATRIURETIC PEPTIDE - Abnormal; Notable for the following:    B Natriuretic Peptide 320.9 (*)    All other components within normal limits  URINALYSIS, ROUTINE W REFLEX MICROSCOPIC - Abnormal; Notable for the following:    Hgb urine dipstick TRACE (*)    All other components within normal limits  URINE MICROSCOPIC-ADD ON  I-STAT TROPOININ, ED   Results for orders placed or performed during the hospital encounter of 08/11/14  Comprehensive metabolic panel  Result Value Ref Range   Sodium 138 135 - 145 mmol/L   Potassium 3.9 3.5 - 5.1 mmol/L   Chloride 104 96 - 112 mmol/L   CO2 27 19 - 32 mmol/L   Glucose, Bld 100 (H) 70 - 99 mg/dL   BUN 27 (H) 6 - 23  mg/dL   Creatinine, Ser 1.61 (H) 0.50 - 1.35 mg/dL   Calcium 8.5 8.4 - 09.6 mg/dL   Total Protein 7.2 6.0 - 8.3 g/dL   Albumin 2.8 (L) 3.5 - 5.2 g/dL   AST 28 0 - 37 U/L   ALT 24 0 - 53 U/L   Alkaline Phosphatase 125 (H) 39 - 117 U/L   Total Bilirubin 1.2 0.3 - 1.2 mg/dL   GFR calc non Af Amer 42 (L) >90 mL/min   GFR calc Af Amer 49 (L) >90 mL/min   Anion gap 7 5 - 15  Protime-INR  Result Value Ref Range   Prothrombin Time 41.5 (H) 11.6 - 15.2 seconds   INR 4.29 (H)  0.00 - 1.49  CBC with Differential/Platelet  Result Value Ref Range   WBC 6.7 4.0 - 10.5 K/uL   RBC 3.80 (L) 4.22 - 5.81 MIL/uL   Hemoglobin 12.5 (L) 13.0 - 17.0 g/dL   HCT 16.138.1 (L) 09.639.0 - 04.552.0 %   MCV 100.3 (H) 78.0 - 100.0 fL   MCH 32.9 26.0 - 34.0 pg   MCHC 32.8 30.0 - 36.0 g/dL   RDW 40.914.0 81.111.5 - 91.415.5 %   Platelets 243 150 - 400 K/uL   Neutrophils Relative % 76 43 - 77 %   Neutro Abs 5.1 1.7 - 7.7 K/uL   Lymphocytes Relative 11 (L) 12 - 46 %   Lymphs Abs 0.8 0.7 - 4.0 K/uL   Monocytes Relative 10 3 - 12 %   Monocytes Absolute 0.6 0.1 - 1.0 K/uL   Eosinophils Relative 3 0 - 5 %   Eosinophils Absolute 0.2 0.0 - 0.7 K/uL   Basophils Relative 0 0 - 1 %   Basophils Absolute 0.0 0.0 - 0.1 K/uL  Brain natriuretic peptide  Result Value Ref Range   B Natriuretic Peptide 320.9 (H) 0.0 - 100.0 pg/mL  Urinalysis, Routine w reflex microscopic  Result Value Ref Range   Color, Urine YELLOW YELLOW   APPearance CLEAR CLEAR   Specific Gravity, Urine 1.008 1.005 - 1.030   pH 7.0 5.0 - 8.0   Glucose, UA NEGATIVE NEGATIVE mg/dL   Hgb urine dipstick TRACE (A) NEGATIVE   Bilirubin Urine NEGATIVE NEGATIVE   Ketones, ur NEGATIVE NEGATIVE mg/dL   Protein, ur NEGATIVE NEGATIVE mg/dL   Urobilinogen, UA 1.0 0.0 - 1.0 mg/dL   Nitrite NEGATIVE NEGATIVE   Leukocytes, UA NEGATIVE NEGATIVE  Urine microscopic-add on  Result Value Ref Range   RBC / HPF 0-2 <3 RBC/hpf  I-Stat Troponin, ED [facility])  Result Value Ref Range    Troponin i, poc 0.01 0.00 - 0.08 ng/mL   Comment 3             Imaging Review [name]  08/11/2014   CLINICAL DATA:  Progressive shortness of breath.  EXAM: PORTABLE CHEST - 1 VIEW  COMPARISON:  01/20/2014  FINDINGS: There is a large right pleural effusion, increased since the prior exam. There is fullness in the right hilum which could represent atelectasis but the possibility of a mass should be considered.  Minimal atelectasis at the left lung base. Pulmonary vascularity is normal.  No acute osseous abnormality.  IMPRESSION: Marked increase in the right pleural effusion. Fullness on the right hilum may be due to atelectasis or fluid but the possibility of a mass should be considered.   Electronically Signed   By: [name]  Maxwell M.D.   On: 08/11/2014 11:19     EKG Interpretation   Date/Time:  Wednesday August 11 2014 10:05:04 EDT Ventricular Rate:  74 PR Interval:    QRS Duration: 118 QT Interval:  526 QTC Calculation: 584 R Axis:   73 Text Interpretation:  Atrial fibrillation Incomplete right bundle branch  block Nonspecific T abnormalities, lateral leads Confirmed by Katiya Fike   MD, Maisa Bedingfield ([id number]) on 08/11/2014 1:20:47 PM      MDM   Final diagnoses:  SOB (shortness of breath)  Pleural effusion    Patient with a large right-sided pleural effusion periods had a pleural effusion in the past according to wife never been tapped being followed by primary care doctor about it. But now with the acute shortness of  breath with exertion this is become sniffily symptomatic will require admission. In addition patient is on Coumadin and INR is above 4 but no active bleeding. Patient has not had any Coumadin today family was notified of the elevated INR yesterday and Coumadin has been held. Patient's mental status is baseline.    Vanetta Mulders, MD 08/11/14 [phone]

## 2014-08-11 NOTE — ED Notes (Signed)
Per EMS, pt coming from PCP for evaluating of increasing SOB x 1 week. Per wife pt has been having exertional dyspnea. NAD at this time. BP: 126/82, HR:72, CBG: 121

## 2014-08-12 ENCOUNTER — Inpatient Hospital Stay (HOSPITAL_COMMUNITY): Payer: Medicare Other

## 2014-08-12 DIAGNOSIS — R791 Abnormal coagulation profile: Secondary | ICD-10-CM

## 2014-08-12 LAB — PROTIME-INR
INR: 3.71 — ABNORMAL HIGH (ref 0.00–1.49)
Prothrombin Time: 37.1 seconds — ABNORMAL HIGH (ref 11.6–15.2)

## 2014-08-12 LAB — BASIC METABOLIC PANEL
Anion gap: 8 (ref 5–15)
BUN: 22 mg/dL (ref 6–23)
CO2: 24 mmol/L (ref 19–32)
CREATININE: 1.12 mg/dL (ref 0.50–1.35)
Calcium: 8.3 mg/dL — ABNORMAL LOW (ref 8.4–10.5)
Chloride: 102 mmol/L (ref 96–112)
GFR calc Af Amer: 66 mL/min — ABNORMAL LOW (ref >90)
GFR calc non Af Amer: 57 mL/min — ABNORMAL LOW (ref >90)
Glucose, Bld: 134 mg/dL — ABNORMAL HIGH (ref 70–99)
Potassium: 3.9 mmol/L (ref 3.5–5.1)
Sodium: 134 mmol/L — ABNORMAL LOW (ref 135–145)

## 2014-08-12 MED ORDER — PHENOL 1.4 % MT LIQD
1.0000 | OROMUCOSAL | Status: DC | PRN
  Filled 2014-08-12: qty 177

## 2014-08-12 MED ORDER — BENZONATATE 100 MG PO CAPS
100.0000 mg | ORAL_CAPSULE | Freq: Three times a day (TID) | ORAL | Status: DC | PRN
  Administered 2014-08-12 – 2014-08-16 (×2): 100 mg via ORAL
  Filled 2014-08-12 (×4): qty 1

## 2014-08-12 MED ORDER — PHYTONADIONE 5 MG PO TABS
2.5000 mg | ORAL_TABLET | Freq: Once | ORAL | Status: AC
  Administered 2014-08-12: 2.5 mg via ORAL
  Filled 2014-08-12: qty 1

## 2014-08-12 MED ORDER — DOCUSATE SODIUM 100 MG PO CAPS
100.0000 mg | ORAL_CAPSULE | Freq: Two times a day (BID) | ORAL | Status: DC
  Administered 2014-08-12 – 2014-08-16 (×8): 100 mg via ORAL
  Filled 2014-08-12 (×10): qty 1

## 2014-08-12 MED ORDER — PHYTONADIONE 5 MG PO TABS: 5.0000 mg | ORAL_TABLET | Freq: Once | ORAL | Status: DC

## 2014-08-12 MED ORDER — STARCH (THICKENING) PO POWD
ORAL | Status: DC | PRN
  Filled 2014-08-12: qty 227

## 2014-08-12 NOTE — Progress Notes (Signed)
RN paged earlier secondary to pt having a terrible cough and being restless. Family concerned. Spoke to RN-pt not in distress. No increased WOB. VSS. O2 sat normal. Pt here with pleural effusion and slated for thoracentesis as soon as INR normalizes. Ordered CXR to recheck-same as previous. Tessalon prn for cough. RR RN to bedside. Support offered to family. KJKG, NP [facility]

## 2014-08-12 NOTE — Progress Notes (Signed)
OT Cancellation Note  Patient Details Name: Francis Thomas MRN: [id number] DOB: 01/31/1925   Cancelled Treatment:    Reason Eval/Treat Not Completed: Patient at procedure or test/ unavailable. Procedure being completed in room. Of note, pt with elevated INR now trending down from 4.29 to 3.71 likely holding thoracentesis until tomorrow. Acute OT will follow up as appropriate/available to complete evaluation.   Francis Thomas, Francis Thomas   Francis Thomas, OTR/L Occupational Therapist [phone] (pager)  08/12/2014, 1:35 PM

## 2014-08-12 NOTE — Significant Event (Addendum)
Rapid Response Event Note  Overview:                                                   Called by Lollie SailsJenny, CN 5W to come and speak with family who were concerned about the patients persistent cough,                                                                     and declining condition overall.       Maren ReamerKaren Kirby, NP had ordered CXR and tessalon pearls for cough. [name]                                                                            And two daughters confirmed DNR                                                               status voicing concerns that they do not want pt to suffer. They are aware that the throracentesis cannot be performed until INR normalizes.   INR today 3.71.   Pt was lying in bed being cleaned of urinary incontinence.  New condom cathered applied. Pt is oriented to self only.  Denies chest or other pain.  States he is aggrivated by the constant dry cough.  Bilateral BS clear in Upper lobes bilaterally and diminished in right base RR 22     98% O2 sats on 3l [place].     171/87  88 Atrial fib.  Pt warm to touch Rectal temp 100.4  CXR results  showing right pleural effusion without change from prior CXR    Time Called: 2058 Arrival Time: 2100 Event Type: Respiratory  Initial Focused Assessment:   Interventions:                          Give tessalon pearls ASAP, continuous pulse OX, Cepacol throat spray                                                     Hand off report to Eve, RN                                                        Continue to follow  pt as needed and offer support to family                                                      F/U 0230:  Tessalon pearls effective in decreasing cough per 5W staff.  PT asleep  Event Summary: Name of Physician Notified: Maren Reamer, NP (Called by 5W staff) at 2050    at    Outcome: Stayed in room and stabalized     Zaki Gertsch, [place] Slider

## 2014-08-12 NOTE — Progress Notes (Addendum)
INITIAL NUTRITION ASSESSMENT  DOCUMENTATION CODES Per approved criteria  -Severe malnutrition in the context of chronic illness   Pt meets criteria for severe MALNUTRITION in the context of chronic illness as evidenced by severe fat and muscle depletion.  INTERVENTION: -Continue Ensure Enlive po BID, each supplement provides 350 kcal and 20 grams of protein  NUTRITION DIAGNOSIS: Malnutrition related to decreased oral intake as evidenced by severe fat and muscle depletion.   Goal: Pt will meet >90% of estimated nutritional needs  Monitor:  PO/supplement intake, labs, weight changes, I/O's  Reason for Assessment: MST=2  81 y.o. male  Admitting Dx: Pleural effusion  Bensen Markus is a 81 y.o. male, with a past medical history significant for atrial fibrillation on chronic Coumadin, benign positional vertigo, bilateral carotid artery disease, and mild dementia. He presented to the emergency department with shortness of breath that has been become significantly worse over the last 24 hours. Mr. Lacko has been followed for a large right pleural effusion for at least the last 6 months. However, on presentation today x-ray shows significant enlargement. His wife reports that he is short of breath with any exertion and is unable to walk up stairs at home.   ASSESSMENT: Pt admitted with pleural effusion. Hx obtained mainly pt pt wife and daughter at bedside. Both report that pt has a good appetite, however, pt has had difficulty swallowing over the past week. Wife reports that pt is usually impulsive when eating and often eats too fast and coughs while consuming foods. They have seen an improvement with his swallowing and coughing since being downgraded to a dysphagia 3 diet with nectar thick liquids. Noted pt consumed 100% of lunch meal and was sipping on Ensure during visit. Pt daughter reports pt's beverages of choice is buttermilk at home. Pt wife reports ongoing weight loss, but disclosed  she is not concerned with this, as he regularly sees his PCP, who is aware of weight loss and seems unconcerned with pt. Pt wife appears to be realistic with goals of care for pt, and states that the weight loss is part of the disease process of Alzheimers. They are unable to provide a wt hx and only current wt is available in EPIC.  Educated importance of good PO intake to promote healing. Discussed principles of dysphagia 3 diet with nectar thick liquids. Family expressed appreciation of visit, but denied further nutrition needs at this time.  Labs reviewed. Na: 134, Calcium: 8.3, Glucose: 134.  Nutrition Focused Physical Exam:  Subcutaneous Fat:  Orbital Region: severe depletion Upper Arm Region: severe depletion Thoracic and Lumbar Region: mild depletion  Muscle:  Temple Region: severe depletion Clavicle Bone Region: severe depletion Clavicle and Acromion Bone Region: severe depletion Scapular Bone Region: severe depletion Dorsal Hand: moderate depletion Patellar Region: severe depletion Anterior Thigh Region: severe depletion Posterior Calf Region: severe depletion  Edema: none present   Height: Ht Readings from Last 1 Encounters:  08/12/14  (1.753 m)    Weight: Wt Readings from Last 1 Encounters:  08/12/14 140 lb 3.4 oz (63.6 kg)    Ideal Body Weight: 160#  % Ideal Body Weight: 88%  Wt Readings from Last 10 Encounters:  08/12/14 140 lb 3.4 oz (63.6 kg)    Usual Body Weight: unknown  % Usual Body Weight: unknown  BMI:  Body mass index is 20.7 kg/(m^2). Normal weight range  Estimated Nutritional Needs: Kcal: 1950-2150 Protein: 85-95 grams Fluid: 2.0-2.2 L  Skin: stage II pressure ulcer on rt  hip  Diet Order: DIET DYS 3 Room service appropriate?: Yes; Fluid consistency:: Nectar Thick  EDUCATION NEEDS: -Education needs addressed   Intake/Output Summary (Last 24 hours) at 08/12/14 1500 Last data filed at 08/12/14 1117  Gross per 24 hour  Intake  915.83 ml  Output    626 ml  Net 289.83 ml    Last BM: 08/11/14  Labs:   Recent Labs Lab 08/11/14 1035 08/12/14 1034  NA 138 134*  K 3.9 3.9  CL 104 102  CO2 27 24  BUN 27* 22  CREATININE 1.43* 1.12  CALCIUM 8.5 8.3*  GLUCOSE 100* 134*    CBG (last 3)  No results for input(s): GLUCAP in the last 72 hours.  Scheduled Meds: . docusate sodium  100 mg Oral BID  . donepezil  10 mg Oral QHS  . dutasteride  0.5 mg Oral QHS  . feeding supplement (ENSURE ENLIVE)  237 mL Oral BID BM  . furosemide  40 mg Oral Daily  . memantine  10 mg Oral BID  . sertraline  25 mg Oral Daily  . sodium chloride  3 mL Intravenous Q12H  . tamsulosin  0.4 mg Oral QHS    Continuous Infusions:   Past Medical History  Diagnosis Date  . Atrial fibrillation   . Osteoporosis   . Benign positional vertigo   . Fatty liver   . Vitamin D deficiency   . Peripheral neuropathy   . Bilateral carotid artery disease   . Gilbert's syndrome     Mild  . Depression   . TIA (transient ischemic attack)     Past Surgical History  Procedure Laterality Date  . Colonoscopy      Eshawn Coor A. Mayford KnifeWilliams, RD, LDN, CDE Pager: [phone] After hours Pager: [phone]

## 2014-08-12 NOTE — Progress Notes (Signed)
Pt w/continous non-prod cough, agitated at times, no sob, physician and RR paged, orders received. Family remains at bedside.

## 2014-08-12 NOTE — Progress Notes (Signed)
  Echocardiogram 2D Echocardiogram has been performed.  Arvil ChacoFoster, Zoriyah Scheidegger 08/12/2014, 1:44 PM

## 2014-08-12 NOTE — Evaluation (Signed)
Physical Therapy Evaluation Patient Details Name: Francis Thomas MRN: [id number] DOB: 07/21/1924 Today's Date: 08/12/2014   History of Present Illness  Patient is an 81 yo male admitted 08/11/14 with increasing SOB.  Patient with Rt lung pleural effusion and HTN.  Patient awaiting thoracentesis - INR too high.  PMH:  Afib, mild dementia, TIA, fall 2 weeks pta, carotid artery disease, vertigo, neuropathy, depression  Clinical Impression  Patient presents with problems listed below.  Will benefit from acute PT to maximize independence prior to discharge.  Patient limited by resp status today - awaiting thoracentesis.      Follow Up Recommendations Home health PT;Supervision for mobility/OOB    Equipment Recommendations  Rolling walker with 5" wheels    Recommendations for Other Services       Precautions / Restrictions Precautions Precautions: Fall Restrictions Weight Bearing Restrictions: No      Mobility  Bed Mobility Overal bed mobility: Needs Assistance Bed Mobility: Supine to Sit;Sit to Supine     Supine to sit: Min assist;HOB elevated Sit to supine: Min assist;HOB elevated   General bed mobility comments: Patient able to move LE's off of bed.  Assist to bring trunk to upright position.  Patient with flexed posture in sitting.  Able to sit EOB x 4 minutes.  Patient with dyspnea and increased WOB.  Returned to supine with assist to bring LE's onto bed.  Transfers                    Ambulation/Gait                Stairs            Wheelchair Mobility    Modified Rankin (Stroke Patients Only)       Balance                                             Pertinent Vitals/Pain Pain Assessment: 0-10 Pain Score: 5  Pain Location: Rt chest Pain Descriptors / Indicators: Pressure;Tightness (with cough) Pain Intervention(s): Monitored during session;Repositioned    Home Living Family/patient expects to be discharged to:: Private  residence Living Arrangements: Spouse/significant other Available Help at Discharge: Family;Available PRN/intermittently (wife works per patient) Type of Home: House Home Access: Stairs to enter Entrance Stairs-Rails: Doctor, general practiceight;Left Entrance Stairs-Number of Steps: 6-8 Home Layout: Two level;Bed/bath upstairs;1/2 bath on main level Home Equipment: Cane - single point      Prior Function Level of Independence: Independent               Hand Dominance        Extremity/Trunk Assessment   Upper Extremity Assessment: Generalized weakness           Lower Extremity Assessment: Generalized weakness         Communication   Communication: No difficulties  Cognition Arousal/Alertness: Awake/alert Behavior During Therapy: Flat affect Overall Cognitive Status: Within Functional Limits for tasks assessed                      General Comments      Exercises        Assessment/Plan    PT Assessment Patient needs continued PT services  PT Diagnosis Difficulty walking;Generalized weakness   PT Problem List Decreased strength;Decreased activity tolerance;Decreased mobility;Cardiopulmonary status limiting activity;Pain  PT Treatment Interventions DME instruction;Gait training;Functional mobility training;Stair  training;Therapeutic activities;Patient/family education   PT Goals (Current goals can be found in the Care Plan section) Acute Rehab PT Goals Patient Stated Goal: To breathe better PT Goal Formulation: With patient Time For Goal Achievement: 08/19/14 Potential to Achieve Goals: Fair    Frequency Min 3X/week   Barriers to discharge Decreased caregiver support Does not have 24 hour assist    Co-evaluation               End of Session   Activity Tolerance: Patient limited by fatigue (Limited by SOB) Patient left: in bed;with call bell/phone within reach;with family/visitor present Nurse Communication: Mobility status         Time:  1610-9604 PT Time Calculation (min) (ACUTE ONLY): 13 min   Charges:   PT Evaluation $Initial PT Evaluation Tier I: 1 Procedure     PT G CodesVena Thomas 08-23-14, 3:59 PM Francis Thomas, [facility] Acute Rehab Services Pager [phone]

## 2014-08-12 NOTE — Progress Notes (Signed)
IR PA aware of request for thoracentesis, will wait until INR closer to 2  Pattricia BossKoreen West Boomershine PA-C Interventional Radiology  08/12/14  7:49 AM

## 2014-08-12 NOTE — Evaluation (Signed)
Clinical/Bedside Swallow Evaluation Patient Details  Name: Francis Thomas MRN: [id number] Date of Birth: 1924-11-03  Today's Date: 08/12/2014 Time: SLP Start Time (ACUTE ONLY): 1030 SLP Stop Time (ACUTE ONLY): 1045 SLP Time Calculation (min) (ACUTE ONLY): 15 min  Past Medical History:  Past Medical History  Diagnosis Date  . Atrial fibrillation   . Osteoporosis   . Benign positional vertigo   . Fatty liver   . Vitamin D deficiency   . Peripheral neuropathy   . Bilateral carotid artery disease   . Gilbert's syndrome     Mild  . Depression   . TIA (transient ischemic attack)    Past Surgical History:  Past Surgical History  Procedure Laterality Date  . Colonoscopy     HPI:  Pt is an 81 y/o male with PMH of atrial fibrilation on chronic Coumadin, benign positional vertigo, bilateral artery disease and mild dementia who came in to ED. Pt has been followed for a large R pleural efussion for the last 6 months, however, current CXR revealed significant enlargement with deviated trachea and a question of an underlying mass. Pt fell aprroximately 2 weeks ago (since admission on 4/20). Dr. Merlene Thomas believed fall was most likely caused by TIA or small stroke.    Assessment / Plan / Recommendation Clinical Impression   Pt was seen for swallowing evaluation. Pt thoracentesis delayed due to INR. SLP proceeded with evaluation. Per RN report pt has demonstrated difficulty swallowing. PO trials with thin liquid elicited delayed cough x3. Trials with nectar thick liquids, puree and solid consistencies did not elicit s/s of aspiration; clear vocal quality.  Recommend pt initiate dys 3/ nectar thick liquid diet. Speech will follow-up for diet tolerance and possible upgrade.     Aspiration Risk  Mild    Diet Recommendation Dysphagia 3 (Mechanical Soft);Nectar-thick liquid   Liquid Administration via: Cup;Straw Medication Administration: Whole meds with puree Supervision: Patient able to self  feed;Intermittent supervision to cue for compensatory strategies Compensations: Slow rate;Small sips/bites Postural Changes and/or Swallow Maneuvers: Seated upright 90 degrees    Other  Recommendations Oral Care Recommendations: Oral care BID Other Recommendations: Order thickener from pharmacy   Follow Up Recommendations       Frequency and Duration min 2x/week  1 week   Pertinent Vitals/Pain     SLP Swallow Goals     Swallow Study Prior Functional Status       General HPI: Pt is an 81 y/o male with PMH of atrial fibrilation on chronic Coumadin, benign positional vertigo, bilateral artery disease and mild dementia who came in to ED. Pt has been followed for a large R pleural efussion for the last 6 months, however, current CXR revealed significant enlargement with deviated trachea and a question of an underlying mass. Pt fell aprroximately 2 weeks ago (since admission on 4/20). Dr. Merlene Thomas believed fall was most likely caused by TIA or small stroke.  Type of Study: Bedside swallow evaluation Previous Swallow Assessment: none Diet Prior to this Study: Regular;Thin liquids Temperature Spikes Noted: No Respiratory Status: Room air History of Recent Intubation: No Behavior/Cognition: Alert;Cooperative;Pleasant mood Oral Cavity - Dentition: Adequate natural dentition;Poor condition Self-Feeding Abilities: Able to feed self;Needs set up Patient Positioning: Upright in bed Baseline Vocal Quality: Clear    Oral/Motor/Sensory Function Overall Oral Motor/Sensory Function: Appears within functional limits for tasks assessed   Ice Chips Ice chips: Not tested   Thin Liquid Thin Liquid: Impaired Pharyngeal  Phase Impairments: Throat Clearing - Delayed;Cough -  Delayed    Nectar Thick Nectar Thick Liquid: Within functional limits   Honey Thick Honey Thick Liquid: Not tested   Puree Puree: Within functional limits   Solid   GO    Solid: Within functional limits        Francis Thomas 08/12/2014,11:06 AM

## 2014-08-12 NOTE — Progress Notes (Signed)
PROGRESS NOTE  Francis Thomas KGM:[id number] DOB: 05/14/1924 DOA: 08/11/2014 PCP: Ginette OttoSTONEKING,HAL THOMAS, MD  Assessment/Plan: Right sided pleural effusion Evident on Cxr beginning in May of 2016. Has progressively become larger. Concern for malignancy given fullness in right hilum. No smoking hx. No family hx of lung cancer. IR consulted for thoracentesis- plan for once INR < 2  CT Chest after thoraentesis is complete. Discussed with Pulmonology who recommends calling them if the fluid is an exudate or if the tap is non diagnostic.  Elevated serum creatinine Uncertain of baseline.  D/c IVF Cr improved  Afib on coumadin with elevated INR. Rate controlled. INR 3.71 still Will give vit K  Recent TIA/CVA- coumadin--- will need to be cautious as it will have to be down for thoracentesis 2-3 weeks ago. While on coumadin. Was evaluated by PCP (Dr. Pete GlatterStoneking) and presumed to be a TIA. No obvious residual. I don't know that any imaging was done of his head. Will check a 2 D echo.  Dementia Continue Aricept and Namenda  HTN Does not appear to be on BP medications at home.  Code Status: DNR Family Communication: wife at bedside Disposition Plan:    Consultants:    Procedures:      HPI/Subjective: +SOB  Objective: Filed Vitals:   08/12/14 0532  BP: 143/72  Pulse: 69  Temp: 98.8 F (37.1 C)  Resp: 19    Intake/Output Summary (Last 24 hours) at 08/12/14 1228 Last data filed at 08/12/14 1117  Gross per 24 hour  Intake 915.83 ml  Output    626 ml  Net 289.83 ml   Filed Weights   08/11/14 1105 08/12/14 0600  Weight: 66.769 kg (147 lb 3.2 oz) 63.6 kg (140 lb 3.4 oz)    Exam:   General:  A+OX3, NAD  Cardiovascular: *rrr  Respiratory: diminished right side  Abdomen: +BS, soft  Musculoskeletal: no edema   Data Reviewed: Basic Metabolic Panel:  Recent Labs Lab 08/11/14 1035 08/12/14 1034  NA 138 134*  K 3.9 3.9  CL 104 102  CO2 27 24    GLUCOSE 100* 134*  BUN 27* 22  CREATININE 1.43* 1.12  CALCIUM 8.5 8.3*   Liver Function Tests:  Recent Labs Lab 08/11/14 1035  AST 28  ALT 24  ALKPHOS 125*  BILITOT 1.2  PROT 7.2  ALBUMIN 2.8*   No results for input(s): LIPASE, AMYLASE in the last 168 hours. No results for input(s): AMMONIA in the last 168 hours. CBC:  Recent Labs Lab 08/11/14 1035  WBC 6.7  NEUTROABS 5.1  HGB 12.5*  HCT 38.1*  MCV 100.3*  PLT 243   Cardiac Enzymes: No results for input(s): CKTOTAL, CKMB, CKMBINDEX, TROPONINI in the last 168 hours. BNP (last 3 results)  Recent Labs  08/11/14 1035  BNP 320.9*    ProBNP (last 3 results) No results for input(s): PROBNP in the last 8760 hours.  CBG: No results for input(s): GLUCAP in the last 168 hours.  No results found for this or any previous visit (from the past 240 hour(s)).   Studies: [name]  08/11/2014   CLINICAL DATA:  Progressive shortness of breath.  EXAM: PORTABLE CHEST - 1 VIEW  COMPARISON:  01/20/2014  FINDINGS: There is a large right pleural effusion, increased since the prior exam. There is fullness in the right hilum which could represent atelectasis but the possibility of a mass should be considered.  Minimal atelectasis at the left lung base. Pulmonary vascularity is  normal.  No acute osseous abnormality.  IMPRESSION: Marked increase in the right pleural effusion. Fullness on the right hilum may be due to atelectasis or fluid but the possibility of a mass should be considered.   Electronically Signed   By: Francene Boyers M.D.   On: 08/11/2014 11:19    Scheduled Meds: . docusate sodium  100 mg Oral BID  . donepezil  10 mg Oral QHS  . dutasteride  0.5 mg Oral QHS  . feeding supplement (ENSURE ENLIVE)  237 mL Oral BID BM  . furosemide  40 mg Oral Daily  . memantine  10 mg Oral BID  . sertraline  25 mg Oral Daily  . sodium chloride  3 mL Intravenous Q12H  . tamsulosin  0.4 mg Oral QHS   Continuous Infusions:   Antibiotics Given (last 72 hours)    None      Principal Problem:   Pleural effusion Active Problems:   Elevated serum creatinine   Dementia   TIA (transient ischemic attack)   Bilateral carotid artery disease   Atrial fibrillation   Benign positional vertigo   Elevated INR   Dyspnea on exertion    Time spent: 35 min    Ollie Delano  [facility] Pager [phone]. If 7PM-7AM, please contact night-coverage at [email or web address], password [facility] 08/12/2014, 12:28 PM  LOS: 1 day

## 2014-08-13 ENCOUNTER — Inpatient Hospital Stay (HOSPITAL_COMMUNITY): Payer: Medicare Other

## 2014-08-13 ENCOUNTER — Encounter (HOSPITAL_COMMUNITY): Payer: Self-pay | Admitting: Radiology

## 2014-08-13 LAB — BASIC METABOLIC PANEL
ANION GAP: 8 (ref 5–15)
BUN: 28 mg/dL — AB (ref 6–23)
CALCIUM: 8.3 mg/dL — AB (ref 8.4–10.5)
CO2: 26 mmol/L (ref 19–32)
CREATININE: 1.19 mg/dL (ref 0.50–1.35)
Chloride: 101 mmol/L (ref 96–112)
GFR calc Af Amer: 61 mL/min — ABNORMAL LOW (ref >90)
GFR calc non Af Amer: 53 mL/min — ABNORMAL LOW (ref >90)
Glucose, Bld: 107 mg/dL — ABNORMAL HIGH (ref 70–99)
Potassium: 3.9 mmol/L (ref 3.5–5.1)
Sodium: 135 mmol/L (ref 135–145)

## 2014-08-13 LAB — URINE CULTURE
CULTURE: NO GROWTH
Colony Count: NO GROWTH

## 2014-08-13 LAB — BODY FLUID CELL COUNT WITH DIFFERENTIAL
EOS FL: 1 %
Lymphs, Fluid: 71 %
MONOCYTE-MACROPHAGE-SEROUS FLUID: 7 % — AB (ref 50–90)
Neutrophil Count, Fluid: 21 % (ref 0–25)
Total Nucleated Cell Count, Fluid: 274 cu mm (ref 0–1000)

## 2014-08-13 LAB — GLUCOSE, SEROUS FLUID: GLUCOSE FL: 71 mg/dL

## 2014-08-13 LAB — CBC
HEMATOCRIT: 35.2 % — AB (ref 39.0–52.0)
Hemoglobin: 11.7 g/dL — ABNORMAL LOW (ref 13.0–17.0)
MCH: 32.3 pg (ref 26.0–34.0)
MCHC: 33.2 g/dL (ref 30.0–36.0)
MCV: 97.2 fL (ref 78.0–100.0)
Platelets: 209 10*3/uL (ref 150–400)
RBC: 3.62 MIL/uL — AB (ref 4.22–5.81)
RDW: 13.6 % (ref 11.5–15.5)
WBC: 8.1 10*3/uL (ref 4.0–10.5)

## 2014-08-13 LAB — PROTIME-INR
INR: 2.44 — ABNORMAL HIGH (ref 0.00–1.49)
PROTHROMBIN TIME: 26.7 s — AB (ref 11.6–15.2)

## 2014-08-13 LAB — PROTEIN, BODY FLUID

## 2014-08-13 LAB — LACTATE DEHYDROGENASE: LDH: 187 U/L (ref 94–250)

## 2014-08-13 LAB — LACTATE DEHYDROGENASE, PLEURAL OR PERITONEAL FLUID: LD, Fluid: 92 U/L — ABNORMAL HIGH (ref 3–23)

## 2014-08-13 MED ORDER — IOHEXOL 300 MG/ML  SOLN
75.0000 mL | Freq: Once | INTRAMUSCULAR | Status: AC | PRN
  Administered 2014-08-13: 75 mL via INTRAVENOUS

## 2014-08-13 MED ORDER — LIDOCAINE HCL (PF) 1 % IJ SOLN
INTRAMUSCULAR | Status: AC
  Administered 2014-08-13: 12:00:00
  Filled 2014-08-13: qty 10

## 2014-08-13 NOTE — Procedures (Signed)
Successful US guided right thoracentesis. Yielded 500 ml of blood tinged fluid. Pt tolerated procedure well. No immediate complications.  Specimen was sent for labs. CXR ordered.  Pattricia BossMORGAN, Sabryna Lahm D PA-C 08/13/2014 11:46 AM

## 2014-08-13 NOTE — Clinical Social Work Note (Signed)
Clinical Social Work Assessment  Patient Details  Name: Francis Thomas MRN: [id number] Date of Birth: 12-Jul-1924  Date of referral:  08/13/14               Reason for consult:  Facility Placement, Family Concerns, Discharge Planning                Permission sought to share information with:  Facility Sport and exercise psychologist Permission granted to share information::  Yes, Verbal Permission Granted  Name::     [place] Patient and SNFs the family is interested in.   Agency::  [facility]  Relationship::  Spouse, daughter  Contact Information:     Housing/Transportation Living arrangements for the past 2 months:  Single Family Home Source of Information:  Spouse Patient Interpreter Needed:  None Criminal Activity/Legal Involvement Pertinent to Current Situation/Hospitalization:  No - Comment as needed Significant Relationships:  Adult Children, Spouse Lives with:  Spouse Do you feel safe going back to the place where you live?  Yes Need for family participation in patient care:  Yes (Comment)  Care giving concerns:  Patient's wife states that she does not have any concerns at this time as she has been caring for the patient at home PTA. Patient's daughter does however express that the wife may be overwhelmed with the patient current medical needs due to her own health concerns (Hx of stroke).    Social Worker assessment / plan:  Patient admitted to hospital from home where he lives with his wife. Patient admitted to the emergency department with shortness of breath. CSW was notified by [facility] that SNF may be a better disposition plan for the patient VS PT's recommendation for home health. CSW met with patient, wife, and daughter at bedside. Family agrees that SNF should be an option at discharge but they feel more comfortable making the decision about SNF closer to date of discharge. CSW provided patient's wife with list of SNFs and explained placement process. CSW explained medicare SNF  benefits and answered questions.   Employment status:  Retired Forensic scientist:  Medicare PT Recommendations:  Home with [place] / Referral to community resources:  [place]  Patient/Family's Response to care:  Family still unsure of what disposition will be but seems open to possible SNF placement.   Patient/Family's Understanding of and Emotional Response to Diagnosis, Current Treatment, and Prognosis:  Wife and daughter were both very appreciative of CSW visit and explanation of disposition options. Wife and daughter are happy with the care the patient has been receiving in the hospital. Both are optimistic that the patient will improve with either a short term rehab stay, or home services.   Emotional Assessment Appearance:  Appears stated age Attitude/Demeanor/Rapport:  Other (Patient not involved in assessment. Wife and daughter both very engaged and appropriate. ) Affect (typically observed):  Accepting, Appropriate, Calm, Happy, Hopeful, Pleasant, Stable Orientation:  Oriented to Self, Oriented to Place Alcohol / Substance use:  Never Used Psych involvement (Current and /or in the community):  No (Comment)  Discharge Needs  Concerns to be addressed:  Discharge Planning Concerns Readmission within the last 30 days:  No Current discharge risk:  Dependent with Mobility, Cognitively Impaired Barriers to Discharge:  Other (Further workup needed.)  Liz Beach MSW, [place], [place], [phone]

## 2014-08-13 NOTE — Progress Notes (Signed)
SLP Cancellation Note  Patient Details Name: Dinah BeersZeb N Bublitz MRN: [id number] DOB: 10/06/1924   Cancelled treatment:       Reason Eval/Treat Not Completed: Patient at procedure or test/unavailable. Attempted to treat pt x2.   Belvin Gauss, Riley NearingBonnie Caroline 08/13/2014, 12:12 PM

## 2014-08-13 NOTE — Progress Notes (Signed)
PT Cancellation Note  Patient Details Name: Francis BeersZeb N Andrada MRN: [id number] DOB: 04/23/1925   Cancelled Treatment:    Reason Eval/Treat Not Completed: Patient at procedure or test/unavailable.  Attempted to see patient x2 today.  In IR in am.  Being taken to radiology this pm.  Will return tomorrow for PT session.   Vena AustriaDavis, Raylee Adamec H 08/13/2014, 6:10 PM Durenda HurtSusan H. Renaldo Fiddleravis, PT, [facility] Acute Rehab Services Pager [phone]

## 2014-08-13 NOTE — Clinical Social Work Placement (Addendum)
   CLINICAL SOCIAL WORK PLACEMENT  NOTE  Date:  08/13/2014  Patient Details  Name: Francis Thomas MRN: [id number] Date of Birth: 11/10/1924  Clinical Social Work is seeking post-discharge placement for this patient at the Skilled  Nursing Facility level of care (*CSW will initial, date and re-position this form in  chart as items are completed):  Yes   Patient/family provided with [facility] Clinical Social Work Department's list of facilities offering this level of care within the geographic area requested by the patient (or if unable, by the patient's family).  Yes   Patient/family informed of their freedom to choose among providers that offer the needed level of care, that participate in Medicare, Medicaid or managed care program needed by the patient, have an available bed and are willing to accept the patient.  Yes   Patient/family informed of [facility]'s ownership interest in [facility] and [facility], as well as of the fact that they are under no obligation to receive care at these facilities.  PASRR submitted to EDS on 08/13/14     PASRR number received on 08/13/14     Existing PASRR number confirmed on       FL2 transmitted to all facilities in geographic area requested by pt/family on 08/13/14     FL2 transmitted to all facilities within larger geographic area on       Patient informed that his/her managed care company has contracts with or will negotiate with certain facilities, including the following:  YES  Patient/family informed of bed offers received 08/14/2014  [facility]    Physician [facility] and [facility]   [facility]  Patient to be transferred to facility by [facility]    Patient family notified on 08/16/2014 of transfer.  Name of family member notified:  [name], [name]     PHYSICIAN       Additional Comment:     _______________________________________________ Roddie McBryant Campbell MSW, [place], [place], [phone]

## 2014-08-13 NOTE — Care Management Note (Addendum)
    Page 1 of 1   08/16/2014     7:35:04 PM CARE MANAGEMENT NOTE 08/16/2014  Patient:  Francis Thomas,Francis Thomas   Account Number:  [id number]  Date Initiated:  08/13/2014  Documentation initiated by:  Lawerance SabalSWIST,DEBBIE  Subjective/Objective Assessment:   pt admitted with SOB, from home with 81 year old wife     Action/Plan:   will follow for discharge needs.   Anticipated DC Date:  08/16/2014   Anticipated DC Plan:  SKILLED NURSING FACILITY  In-house referral  Clinical Social Worker      DC Planning Services  CM consult      Choice offered to / List presented to:             Status of service:  Completed, signed off Medicare Important Message given?  YES (If response is "NO", the following Medicare IM given date fields will be blank) Date Medicare IM given:  08/13/2014 Medicare IM given by:  Lawerance SabalSWIST,DEBBIE Date Additional Medicare IM given:  08/16/2014 Additional Medicare IM given by:  [facility] SWIST  Discharge Disposition:  SKILLED NURSING FACILITY  Per UR Regulation:  Reviewed for med. necessity/level of care/duration of stay  If discussed at Long Length of Stay Meetings, dates discussed:    Comments:  08-16-14 pt given IM letter, dc today to SNF Lawerance Sabalebbie Swist RN BSN CM  08-13-14 will follow for discharge disposition. Spoke with daughter and she states that she is not confident her 81 year old mom will be able to handle everything afer discharge. SW updated. Lawerance Sabalebbie Swist RN BSN CM

## 2014-08-13 NOTE — Progress Notes (Signed)
OT Cancellation Note  Patient Details Name: Francis Thomas MRN: [id number] DOB: 05/12/1924   Cancelled Treatment:    Reason Eval/Treat Not Completed: Patient at procedure or test/ unavailable. Pt at US for thoracentesis. Acute OT will follow up as available.   Nena JordanMiller, Lynelle Weiler M 08/13/2014, 11:56 AM

## 2014-08-13 NOTE — Progress Notes (Signed)
PROGRESS NOTE  Francis Thomas:[id number] DOB: 02/19/1925 DOA: 08/11/2014 PCP: Ginette Otto, MD  Assessment/Plan: Right sided pleural effusion Evident on Cxr beginning in May of 2016. Has progressively become larger. Concern for malignancy given fullness in right hilum. No smoking hx. No family hx of lung cancer. IR consulted for thoracentesis- plan for once INR about 2  CT Chest after thoraentesis is complete. Discussed with Pulmonology who recommends calling them if the fluid is an exudate or if the tap is non diagnostic.  Elevated serum creatinine Uncertain of baseline.  D/c IVF Cr improved  Afib on coumadin with elevated INR. Rate controlled.  S/p INR- vit K given  Recent TIA/CVA- coumadin-- 2-3 weeks ago. While on coumadin. Was evaluated by PCP (Dr. Pete Glatter) and presumed to be a TIA. No obvious residual. Echo  Dementia Continue Aricept and Namenda  HTN Does not appear to be on BP medications at home.  Code Status: DNR Family Communication: wife/granddaughter at bedside Disposition Plan:    Consultants:  IR  Procedures:      HPI/Subjective: Had night full of dry cough  Objective: Filed Vitals:   08/13/14 0534  BP:   Pulse:   Temp: 98.8 F (37.1 C)  Resp:     Intake/Output Summary (Last 24 hours) at 08/13/14 1002 Last data filed at 08/12/14 2208  Gross per 24 hour  Intake    360 ml  Output    500 ml  Net   -140 ml   Filed Weights   08/11/14 1105 08/12/14 0600  Weight: 66.769 kg (147 lb 3.2 oz) 63.6 kg (140 lb 3.4 oz)    Exam:   General:  Pleasant/cooperative  Cardiovascular: rrr  Respiratory: diminished right side, no wheezing  Abdomen: +BS, soft  Musculoskeletal: no edema   Data Reviewed: Basic Metabolic Panel:  Recent Labs Lab 08/11/14 1035 08/12/14 1034 08/13/14 0717  NA 138 134* 135  K 3.9 3.9 3.9  CL 104 102 101  CO2 GLUCOSE 100* 134* 107*  BUN 27* 22 28*  CREATININE 1.43* 1.12 1.19   CALCIUM 8.5 8.3* 8.3*   Liver Function Tests:  Recent Labs Lab 08/11/14 1035  AST 28  ALT 24  ALKPHOS 125*  BILITOT 1.2  PROT 7.2  ALBUMIN 2.8*   No results for input(s): LIPASE, AMYLASE in the last 168 hours. No results for input(s): AMMONIA in the last 168 hours. CBC:  Recent Labs Lab 08/11/14 1035 08/13/14 0717  WBC 6.7 8.1  NEUTROABS 5.1  --   HGB 12.5* 11.7*  HCT 38.1* 35.2*  MCV 100.3* 97.2  PLT 243 209   Cardiac Enzymes: No results for input(s): CKTOTAL, CKMB, CKMBINDEX, TROPONINI in the last 168 hours. BNP (last 3 results)  Recent Labs  08/11/14 1035  BNP 320.9*    ProBNP (last 3 results) No results for input(s): PROBNP in the last 8760 hours.  CBG: No results for input(s): GLUCAP in the last 168 hours.  Recent Results (from the past 240 hour(s))  Culture, Urine     Status: None   Collection Time: 08/11/14  6:09 PM  Result Value Ref Range Status   Specimen Description URINE, CLEAN CATCH  Final   Special Requests NONE  Final   Colony Count NO GROWTH Performed at [facility]   Final   Culture NO GROWTH Performed at [facility]   Final   Report Status 08/13/2014 FINAL  Final     Studies: Dg Chest Port 1  View  08/12/2014   CLINICAL DATA:  Pleural effusion and worsening shortness of breath.  EXAM: PORTABLE CHEST - 1 VIEW  COMPARISON:  08/11/2014  FINDINGS: Exam again demonstrates moderate opacification over the right mid to lower lung likely moderate size effusion with associated atelectasis. Cannot exclude infection in the right lung. Prominent right hilar density unchanged which may be due to mass or adenopathy. Left lung is clear. Remainder the exam is unchanged.  IMPRESSION: Moderate size right pleural effusion without significant change. Opacification over the right mid to lower lung which may be due to associated atelectasis or infection. Prominent right hilar density as cannot exclude adenopathy or mass. Recommend CT with  contrast for further evaluation.   Electronically Signed   By: Elberta Fortisaniel  Boyle M.D.   On: 08/12/2014 21:20   [name]  08/11/2014   CLINICAL DATA:  Progressive shortness of breath.  EXAM: PORTABLE CHEST - 1 VIEW  COMPARISON:  01/20/2014  FINDINGS: There is a large right pleural effusion, increased since the prior exam. There is fullness in the right hilum which could represent atelectasis but the possibility of a mass should be considered.  Minimal atelectasis at the left lung base. Pulmonary vascularity is normal.  No acute osseous abnormality.  IMPRESSION: Marked increase in the right pleural effusion. Fullness on the right hilum may be due to atelectasis or fluid but the possibility of a mass should be considered.   Electronically Signed   By: [name]  Maxwell M.D.   On: 08/11/2014 11:19    Scheduled Meds: . docusate sodium  100 mg Oral BID  . donepezil  10 mg Oral QHS  . dutasteride  0.5 mg Oral QHS  . feeding supplement (ENSURE ENLIVE)  237 mL Oral BID BM  . furosemide  40 mg Oral Daily  . memantine  10 mg Oral BID  . sertraline  25 mg Oral Daily  . sodium chloride  3 mL Intravenous Q12H  . tamsulosin  0.4 mg Oral QHS   Continuous Infusions:  Antibiotics Given (last 72 hours)    None      Principal Problem:   Pleural effusion Active Problems:   Elevated serum creatinine   Dementia   TIA (transient ischemic attack)   Bilateral carotid artery disease   Atrial fibrillation   Benign positional vertigo   Elevated INR   Dyspnea on exertion    Time spent: 25 min    Francis Thomas  [facility] Pager [phone]. If 7PM-7AM, please contact night-coverage at [email or web address], password [email or web address] 08/13/2014, 10:02 AM  LOS: 2 days

## 2014-08-13 NOTE — Progress Notes (Signed)
Utilization review completed.  

## 2014-08-14 DIAGNOSIS — I4891 Unspecified atrial fibrillation: Secondary | ICD-10-CM

## 2014-08-14 LAB — CBC
HEMATOCRIT: 35.2 % — AB (ref 39.0–52.0)
Hemoglobin: 11.8 g/dL — ABNORMAL LOW (ref 13.0–17.0)
MCH: 32.6 pg (ref 26.0–34.0)
MCHC: 33.5 g/dL (ref 30.0–36.0)
MCV: 97.2 fL (ref 78.0–100.0)
Platelets: 213 10*3/uL (ref 150–400)
RBC: 3.62 MIL/uL — ABNORMAL LOW (ref 4.22–5.81)
RDW: 13.8 % (ref 11.5–15.5)
WBC: 7.1 10*3/uL (ref 4.0–10.5)

## 2014-08-14 LAB — PROTIME-INR
INR: 1.81 — ABNORMAL HIGH (ref 0.00–1.49)
Prothrombin Time: 21.1 seconds — ABNORMAL HIGH (ref 11.6–15.2)

## 2014-08-14 LAB — BASIC METABOLIC PANEL
ANION GAP: 10 (ref 5–15)
BUN: 26 mg/dL — ABNORMAL HIGH (ref 6–23)
CO2: 25 mmol/L (ref 19–32)
Calcium: 8.4 mg/dL (ref 8.4–10.5)
Chloride: 101 mmol/L (ref 96–112)
Creatinine, Ser: 1.25 mg/dL (ref 0.50–1.35)
GFR calc Af Amer: 58 mL/min — ABNORMAL LOW (ref >90)
GFR calc non Af Amer: 50 mL/min — ABNORMAL LOW (ref >90)
Glucose, Bld: 80 mg/dL (ref 70–99)
POTASSIUM: 3.8 mmol/L (ref 3.5–5.1)
SODIUM: 136 mmol/L (ref 135–145)

## 2014-08-14 LAB — STREP PNEUMONIAE URINARY ANTIGEN: Strep Pneumo Urinary Antigen: NEGATIVE

## 2014-08-14 LAB — AMYLASE, PLEURAL FLUID: AMYLASE, PLEURAL FLUID: 9 U/L

## 2014-08-14 MED ORDER — DOXYCYCLINE HYCLATE 100 MG PO TABS
100.0000 mg | ORAL_TABLET | Freq: Two times a day (BID) | ORAL | Status: DC
  Administered 2014-08-14 – 2014-08-16 (×5): 100 mg via ORAL
  Filled 2014-08-14 (×6): qty 1

## 2014-08-14 MED ORDER — ASPIRIN 325 MG PO TABS
325.0000 mg | ORAL_TABLET | Freq: Every day | ORAL | Status: DC
  Administered 2014-08-14 – 2014-08-16 (×3): 325 mg via ORAL
  Filled 2014-08-14 (×3): qty 1

## 2014-08-14 MED ORDER — CLINDAMYCIN PHOSPHATE 600 MG/50ML IV SOLN
600.0000 mg | Freq: Three times a day (TID) | INTRAVENOUS | Status: DC
Start: 2014-08-14 — End: 2014-08-16
  Administered 2014-08-14 – 2014-08-16 (×7): 600 mg via INTRAVENOUS
  Filled 2014-08-14 (×9): qty 50

## 2014-08-14 MED ORDER — AZITHROMYCIN 500 MG PO TABS
500.0000 mg | ORAL_TABLET | Freq: Every day | ORAL | Status: DC
  Administered 2014-08-14: 500 mg via ORAL

## 2014-08-14 NOTE — Progress Notes (Signed)
PROGRESS NOTE  Francis Thomas ZOX:[id number] DOB: Nov 22, 1924 DOA: 08/11/2014 PCP: Ginette Otto, MD  Summary: Chart reviewed.  81 year old white male with history of atrial fibrillation, dementia, acute on chronic right pleural effusion presents with severe dyspnea. Recently started on Coumadin for possible TIA per family, and history of atrial fibrillation.  Had thoracentesis yesterday.  Assessment/Plan: Right sided pleural effusion Evident on Cxr beginning in May of 2015. Has progressively become larger. S/p 500 cc tap: blood tinged. Transudative. CT shows no neoplasm but does show probable bilateral pneumonia and parapneumonic effusion. Will start abx for possible aspiration pneumonia (swallowing difficulty) and CAP coverage.  avoid invasive procedures like chest tube, VATs, etc. Resume lasix.  Echo shows right heart failure and pulmonary hypertension with pulmonary artery pressure of 63. Left ventricular function normal.  Could be contributing to above. Resume Lasix.  Elevated serum creatinine Uncertain of baseline.    Afib on coumadin with elevated INR. Rate controlled.  Discussed with Dr. Kevan Ny. In this gentleman with large pleural effusion, dementia, deconditioning, now needing skilled nursing facility, risk might be greater than the benefit. We both agree to stopping Coumadin and instead placing patient on aspirin. Thoracentesis wasn't grossly bloody but was blood-tinged. May have contributed to increasing size of effusion.  Recent TIA/CVA- coumadin-- 2-3 weeks ago. While on coumadin. Was evaluated by PCP (Dr. Pete Glatter) and presumed to be a TIA. No obvious residual. Echo:  Shows no source of embolus but does show pulmonary hypertension and right-sided heart failure.  Dysphagia: Speech therapy recommending dysphagia 3 diet with nectar thickened liquids.  Dementia Continue Aricept and Namenda  HTN Does not appear to be on BP medications at home.  Deconditioning:  Family agreeable to short-term skilled nursing facility placement  Code Status: DNR Family Communication: wife/granddaughter at bedside Disposition Plan: Skilled nursing facility in a few days   Consultants:  IR  Procedures:  Thoracentesis    HPI/Subjective: Per family, seems less short of breath. Had a cough week or 2 prior to admission. No fevers or chills. They have noticed him coughing while eating over the past few weeks.  Objective: Filed Vitals:   08/14/14 0527  BP: 128/66  Pulse: 64  Temp: 98.6 F (37 C)  Resp: 18    Intake/Output Summary (Last 24 hours) at 08/14/14 1315 Last data filed at 08/14/14 0900  Gross per 24 hour  Intake    120 ml  Output      0 ml  Net    120 ml   Filed Weights   08/11/14 1105 08/12/14 0600  Weight: 66.769 kg (147 lb 3.2 oz) 63.6 kg (140 lb 3.4 oz)    Exam:   General:  Pleasant/cooperative. Confused in chair. Answers questions but impulsive  Cardiovascular: rrr without murmurs gallops rubs  Respiratory: diminished right side, no wheezing  Abdomen: +BS, soft nontender nondistended  Musculoskeletal: no edema no clubbing or cyanosis  Data Reviewed: Basic Metabolic Panel:  Recent Labs Lab 08/11/14 1035 08/12/14 1034 08/13/14 0717 08/14/14 0555  NA 138 134* 135 136  K 3.9 3.9 3.9 3.8  CL 104 102 101 101  CO2 27 24 26 25   GLUCOSE 100* 134* 107* 80  BUN 27* 22 28* 26*  CREATININE 1.43* 1.12 1.19 1.25  CALCIUM 8.5 8.3* 8.3* 8.4   Liver Function Tests:  Recent Labs Lab 08/11/14 1035  AST 28  ALT 24  ALKPHOS 125*  BILITOT 1.2  PROT 7.2  ALBUMIN 2.8*   No results for input(s): LIPASE, AMYLASE  in the last 168 hours. No results for input(s): AMMONIA in the last 168 hours. CBC:  Recent Labs Lab 08/11/14 1035 08/13/14 0717 08/14/14 0555  WBC 6.7 8.1 7.1  NEUTROABS 5.1  --   --   HGB 12.5* 11.7* 11.8*  HCT 38.1* 35.2* 35.2*  MCV 100.3* 97.2 97.2  PLT 243 209 213   Cardiac Enzymes: No results for  input(s): CKTOTAL, CKMB, CKMBINDEX, TROPONINI in the last 168 hours. BNP (last 3 results)  Recent Labs  08/11/14 1035  BNP 320.9*    ProBNP (last 3 results) No results for input(s): PROBNP in the last 8760 hours.  CBG: No results for input(s): GLUCAP in the last 168 hours.  Recent Results (from the past 240 hour(s))  Culture, Urine     Status: None   Collection Time: 08/11/14  6:09 PM  Result Value Ref Range Status   Specimen Description URINE, CLEAN CATCH  Final   Special Requests NONE  Final   Colony Count NO GROWTH Performed at [facility]   Final   Culture NO GROWTH Performed at [facility]   Final   Report Status 08/13/2014 FINAL  Final  Body fluid culture     Status: None (Preliminary result)   Collection Time: 08/13/14 12:46 PM  Result Value Ref Range Status   Specimen Description FLUID RIGHT PLEURAL  Final   Special Requests NONE  Final   Gram Stain   Final    NO WBC SEEN NO ORGANISMS SEEN Performed at [facility]    Culture PENDING  Incomplete   Report Status PENDING  Incomplete     Studies: Dg Chest 1 View  08/13/2014   CLINICAL DATA:  Status post right-sided thoracentesis  EXAM: CHEST  1 VIEW  COMPARISON:  Chest x-ray of August 12, 2014.  FINDINGS: The there has been interval removal of a portion of the pleural effusion on the right. A moderate amount of fluid remains. There is no postprocedure pneumothorax. There is chronic mediastinal shift towards the right. The left lung is clear. There is a small pleural effusion at the left lung base.  IMPRESSION: There is no postprocedure complication following right-sided thoracentesis. The volume of pleural fluid present has decreased somewhat.   Electronically Signed   By: David  [place] M.D.   On: 08/13/2014 13:01   Ct Chest W Contrast  08/13/2014   CLINICAL DATA:  Shortness of breath. Evaluate right pleural effusion. Current history of atrial fibrillation.  EXAM: CT CHEST WITH CONTRAST   TECHNIQUE: Multidetector CT imaging of the chest was performed during intravenous contrast administration.  CONTRAST:  75mL OMNIPAQUE IOHEXOL 300 MG/ML IV.  COMPARISON:  No prior CT. Multiple prior chest x-rays, most recently earlier same date.  FINDINGS: Lungs: Airspace consolidation in the right lower lobe with a "drowned lung" appearance. Numerous calcified granulomata are present within the consolidated lung. Mild ground-glass airspace opacity is present in the superior segment right lower lobe, best seen on the coronal reformatted images. Dense airspace consolidation is present in the left lower lobe. Minimal ground-glass airspace opacities are present in the left upper lobe. No visible pulmonary parenchymal nodules or masses, though the consolidation in the right lower lobe has a masslike appearance.  Trachea/bronchi: Central airways patent without significant bronchial wall thickening. Calcification involving the tracheobronchial cartilages.  Pleura: Moderately large right pleural effusion which is partially loculated. Slight pleural enhancement on the right with the Hounsfield measurements of the pleural fluid approximating 20. Small simple left  pleural effusion (Hounsfield measurement 3) without evidence of pleural enhancement.  Mediastinum:  No mediastinal masses.  Heart and Vascular: Heart enlarged with massive right atrial enlargement and moderate left atrial enlargement. Small pericardial effusion. Extensive 3 vessel coronary atherosclerosis. Moderate to severe atherosclerosis involving the thoracic and upper abdominal aorta without aneurysm. Atherosclerosis involving the proximal great vessels with a stenosis involving the proximal left subclavian artery which does not appear hemodynamically significant.  Lymphatic: No pathologic mediastinal, hilar or axillary lymphadenopathy.  Lower neck: Atrophic thyroid gland. No supraclavicular lymphadenopathy.  Visualized upper abdomen: Blurred by respiratory  motion. Reflux of contrast into the IVC and the hepatic veins. Small hiatal hernia.  Musculoskeletal: Multiple osteoporotic compression fractures including T6, T9, T10, T 11, T12, L1 and L2, worst at T12 and L1, with associated kyphous deformity.  Other findings:  Mild bilateral gynecomastia.  IMPRESSION: 1. Moderately large right pleural effusion with enhancement of the pleura and increased attenuation of the pleural fluid. 2. Dense airspace consolidation involving the right lower lobe and right middle lobe with "drowned lung" appearance. Therefore, the right pleural effusion is likely a parapneumonic effusion and the enhancement of the pleura and partial loculation may indicate a developing empyema. 3. Small simple left pleural effusion and associated passive atelectasis versus pneumonia in the left lower lobe. 4. Patchy pneumonia involving the superior segment right lower lobe and the left upper lobe. 5. Cardiomegaly with massive right atrial enlargement and moderate left atrial enlargement. Reflux of contrast into the IVC and hepatic veins is consistent with right heart failure or tricuspid regurgitation.   Electronically Signed   By: Hulan Saashomas  Lawrence M.D.   On: 08/13/2014 21:05   [name]  08/12/2014   CLINICAL DATA:  Pleural effusion and worsening shortness of breath.  EXAM: PORTABLE CHEST - 1 VIEW  COMPARISON:  08/11/2014  FINDINGS: Exam again demonstrates moderate opacification over the right mid to lower lung likely moderate size effusion with associated atelectasis. Cannot exclude infection in the right lung. Prominent right hilar density unchanged which may be due to mass or adenopathy. Left lung is clear. Remainder the exam is unchanged.  IMPRESSION: Moderate size right pleural effusion without significant change. Opacification over the right mid to lower lung which may be due to associated atelectasis or infection. Prominent right hilar density as cannot exclude adenopathy or mass.  Recommend CT with contrast for further evaluation.   Electronically Signed   By: Elberta Fortisaniel  Boyle M.D.   On: 08/12/2014 21:20   [place] Thoracentesis Asp Pleural Space W/img Guide  08/13/2014   INDICATION: Symptomatic right sided pleural effusion  EXAM: US THORACENTESIS ASP PLEURAL SPACE W/IMG GUIDE  COMPARISON:  CXR 08/12/14.  MEDICATIONS: None  COMPLICATIONS: None immediate  TECHNIQUE: Informed written consent was obtained from the patient after a discussion of the risks, benefits and alternatives to treatment. A timeout was performed prior to the initiation of the procedure.  Initial ultrasound scanning demonstrates a right pleural effusion. The lower chest was prepped and draped in the usual sterile fashion. 1% lidocaine was used for local anesthesia.  Under direct ultrasound guidance, a 19 gauge, 7-cm, Yueh catheter was introduced. An ultrasound image was saved for documentation purposes. The thoracentesis was performed. The catheter was removed and a dressing was applied. The patient tolerated the procedure well without immediate post procedural complication. The patient was escorted to have an upright chest radiograph.  FINDINGS: A total of approximately 500 ml of blood tinged fluid was removed. Requested samples were  sent to the laboratory.  IMPRESSION: Successful ultrasound-guided right sided thoracentesis yielding 500 ml of pleural fluid.  Read By:  Pattricia Boss PA-C   Electronically Signed   By: Richarda Overlie M.D.   On: 08/13/2014 12:25   Echocardiogram Left ventricle: The cavity size was normal. Wall thickness was increased in a pattern of moderate LVH. Systolic function was normal. The estimated ejection fraction was in the range of 55% to 60%. Wall motion was normal; there were no regional wall motion abnormalities. - Aortic valve: Cusp separation was reduced. There was mild to moderate stenosis. Valve area (VTI): 0.87 cm^2. Valve area (Vmax): 0.75 cm^2. Valve area (Vmean): 0.85 cm^2. -  Mitral valve: Mildly calcified annulus. - Left atrium: The atrium was severely dilated. - Right ventricle: The cavity size was moderately to severely dilated. Wall thickness was normal. - Right atrium: The atrium was moderately to severely dilated. - Tricuspid valve: There was moderate regurgitation. - Pulmonary arteries: Systolic pressure was moderately to severely increased. PA peak pressure: 62 mm Hg (S).  Scheduled Meds: . docusate sodium  100 mg Oral BID  . donepezil  10 mg Oral QHS  . dutasteride  0.5 mg Oral QHS  . feeding supplement (ENSURE ENLIVE)  237 mL Oral BID BM  . furosemide  40 mg Oral Daily  . memantine  10 mg Oral BID  . sertraline  25 mg Oral Daily  . sodium chloride  3 mL Intravenous Q12H  . tamsulosin  0.4 mg Oral QHS   Continuous Infusions:  Antibiotics Given (last 72 hours)    None      Principal Problem:   Pleural effusion Active Problems:   Elevated serum creatinine   Dementia   TIA (transient ischemic attack)   Bilateral carotid artery disease   Atrial fibrillation   Benign positional vertigo   Elevated INR   Dyspnea on exertion    Time spent: 35 min including reviewing records, personally reviewing CAT scan chest x-ray, talking to family and discussing with Dr. Jiles Crocker  [facility] Pager [phone] [email or web address], password [facility] 08/14/2014, 1:15 PM  LOS: 3 days

## 2014-08-14 NOTE — Progress Notes (Signed)
SLP Cancellation Note  Patient Details Name: Francis Thomas MRN: [id number] DOB: 12/18/1924   Cancelled treatment:       Reason Eval/Treat Not Completed: Upon SLP arrival, wife says that pt is needing to be cleaned up (NT already aware), and requests that SLP return at a later time. She reports overall improvement (less coughing) since yesterday. Will f/u as schedule allows.   Francis Thomas, M.A. CCC-SLP [phone]  Francis Thomas, Francis Thomas 08/14/2014, 10:56 AM

## 2014-08-14 NOTE — Progress Notes (Signed)
Speech Language Pathology Treatment: Dysphagia  Patient Details Name: Francis Thomas MRN: [id number] DOB: 1925-01-05 Today's Date: 08/14/2014 Time: 1450-1505 SLP Time Calculation (min) (ACUTE ONLY): 15 min  Assessment / Plan / Recommendation Clinical Impression  Pt sitting in recliner with son-in-law present.  Presents with active mastication of solids (family provided takeout chicken/pita) - he is cautious about waiting until his oral cavity is clear before he drinks liquid, concerned that he may cough otherwise.  Once oral cavity clear, pt consumed thin liquids with coughing on fewer than 10% of trials.  Improved motivation to eat today per son-in-law.  Recommend resuming thin liquids with basic precautions, particularly sitting upright for meals.  Continue meds whole with puree.  No further SLP f/u warranted - will sign off.    HPI HPI: Pt is an 81 y/o male with PMH of atrial fibrilation on chronic Coumadin, benign positional vertigo, bilateral artery disease and mild dementia who came in to ED. Pt has been followed for a large R pleural efussion for the last 6 months, however, current CXR revealed significant enlargement with deviated trachea and a question of an underlying mass. Pt fell aprroximately 2 weeks ago (since admission on 4/20). Dr. Lajean Manes believed fall was most likely caused by TIA or small stroke.    Pertinent Vitals Pain Assessment: No/denies pain  SLP Plan  All goals met    Recommendations Diet recommendations: Dysphagia 3 (mechanical soft);Thin liquid Liquids provided via: Cup Medication Administration: Whole meds with puree Supervision: Patient able to self feed;Intermittent supervision to cue for compensatory strategies Compensations: Slow rate;Small sips/bites Postural Changes and/or Swallow Maneuvers: Seated upright 90 degrees              Oral Care Recommendations: Oral care BID Plan: All goals met   Francis Thomas L. Tivis Ringer, [place] CCC/SLP Pager [phone]       Francis Thomas 08/14/2014, 3:09 PM

## 2014-08-14 NOTE — Social Work (Signed)
CSW met with patient and family in order to present bed offers. Family was interested in [facility] and [facility]. Once [facility] offered a bed, family was agreeable to that offer. CSW encouraged family to tour facility in order to confirm their decision. Patient can transition to facility once medically cleared. CSW provided time to answer several of the family's questions. Family stated they were thankful for this level of support.   Christene Lye MSW, [place]

## 2014-08-14 NOTE — Progress Notes (Signed)
Physical Therapy Treatment Patient Details Name: Francis Thomas MRN: [id number] DOB: 03-22-1925 Today's Date: 08/14/2014    History of Present Illness Patient is an 81 yo male admitted 08/11/14 with increasing SOB.  Patient with Rt lung pleural effusion and HTN.  Patient awaiting thoracentesis - INR too high.  PMH:  Afib, mild dementia, TIA, fall 2 weeks pta, carotid artery disease, vertigo, neuropathy, depression    PT Comments    Patient making good progress with mobility and gait.  More alert today, but decreased cognition.  Less dyspnea during mobility today.  Do not feel wife can care for patient at current functional level.  Recommend SNF at discharge.   Follow Up Recommendations  SNF;Supervision/Assistance - 24 hour     Equipment Recommendations  Rolling walker with 5" wheels    Recommendations for Other Services       Precautions / Restrictions Precautions Precautions: Fall Restrictions Weight Bearing Restrictions: No    Mobility  Bed Mobility Overal bed mobility: Needs Assistance Bed Mobility: Supine to Sit;Sit to Supine     Supine to sit: Min assist Sit to supine: Min guard   General bed mobility comments: Min assist to bring trunk to sitting position. Practiced supine > sit x2.  Patient able to return to supine with no physical assist.  Transfers Overall transfer level: Needs assistance Equipment used: Rolling walker (2 wheeled) Transfers: Sit to/from [employer] Sit to Stand: Min assist Stand pivot transfers: Mod assist       General transfer comment: Verbal cues for hand placement.  Assist to rise to standing and for balance.  Patient with flexed posture.  At end of session, patient performed stand-pivot transfer bed to recliner with mod assist for balance/safety.  Ambulation/Gait Ambulation/Gait assistance: Min assist Ambulation Distance (Feet): 30 Feet Assistive device: Rolling walker (2 wheeled) Gait Pattern/deviations: Step-through  pattern;Decreased stride length;Shuffle;Trunk flexed Gait velocity: Decreased Gait velocity interpretation: Below normal speed for age/gender General Gait Details: Verbal cues for safe use of RW.  Multiple cues to keep RW close to body.  Patient with flexed posture and RW too far ahead of himself.  Minimal dyspnea noted today.   Stairs            Wheelchair Mobility    Modified Rankin (Stroke Patients Only)       Balance Overall balance assessment: Needs assistance         Standing balance support: Bilateral upper extremity supported Standing balance-Leahy Scale: Poor                      Cognition Arousal/Alertness: Awake/alert Behavior During Therapy: Restless Overall Cognitive Status: Impaired/Different from baseline Area of Impairment: Orientation;Attention;Safety/judgement;Awareness;Problem solving Orientation Level: Disoriented to;Time;Situation Current Attention Level: Sustained     Safety/Judgement: Decreased awareness of safety   Problem Solving: Slow processing;Difficulty sequencing;Requires verbal cues General Comments: Patient more alert today, and more interactive.  Note patient pulling on tubes.  When removed covers, patient's IV was out and laying on bed.  RN notified.    Exercises      General Comments        Pertinent Vitals/Pain Pain Assessment: No/denies pain    Home Living                      Prior Function            PT Goals (current goals can now be found in the care plan section) Progress towards PT goals:  Progressing toward goals    Frequency  Min 3X/week    PT Plan Discharge plan needs to be updated    Co-evaluation             End of Session Equipment Utilized During Treatment: Gait belt Activity Tolerance: Patient tolerated treatment well;Patient limited by fatigue Patient left: in chair;with call bell/phone within reach;with family/visitor present     Time: 1138-1205 PT Time Calculation  (min) (ACUTE ONLY): 27 min  Charges:  $Gait Training: 8-22 mins $Therapeutic Activity: 8-22 mins                    G Codes:      Vena AustriaDavis, Tillman Kazmierski H 08/14/2014, 4:23 PM Durenda HurtSusan H. Renaldo Fiddleravis, PT, [facility] CenterMBA Acute Rehab Services Pager [phone]

## 2014-08-15 NOTE — Progress Notes (Signed)
PROGRESS NOTE  Francis Thomas ZOX:[id number] DOB: 03/22/1925 DOA: 08/11/2014 PCP: Ginette Otto, MD  Summary: 81 year old white male with history of atrial fibrillation, dementia, acute on chronic right pleural effusion presents with severe dyspnea. Recently started on Coumadin for possible TIA per family, and history of atrial fibrillation.  Had thoracentesis yesterday.  Assessment/Plan: Right sided pleural effusion Evident on Cxr beginning in May of 2015. Has progressively become larger. S/p 500 cc tap: blood tinged. Transudative. CT shows no neoplasm but does show probable bilateral pneumonia and parapneumonic effusion. Will start abx for possible aspiration pneumonia (swallowing difficulty) and CAP coverage.  avoid invasive procedures like chest tube, VATs, etc. Resumed lasix.  Echo shows right heart failure and pulmonary hypertension with pulmonary artery pressure of 63. Left ventricular function normal.  Could be contributing to above. Resumed Lasix.  Elevated serum creatinine Uncertain of baseline.    Afib on coumadin with elevated INR. Rate controlled.  Discussed with Dr. Kevan Ny. In this gentleman with large pleural effusion, dementia, deconditioning, now needing skilled nursing facility, risk might be greater than the benefit. We both agree to stopping Coumadin and instead placing patient on aspirin. Thoracentesis wasn't grossly bloody but was blood-tinged. May have contributed to increasing size of effusion.  Recent TIA/CVA- coumadin-- 2-3 weeks ago. While on coumadin. Was evaluated by PCP (Dr. Pete Glatter) and presumed to be a TIA. No obvious residual. Echo:  Shows no source of embolus but does show pulmonary hypertension and right-sided heart failure.  Dysphagia: Speech therapy recommending dysphagia 3 diet with nectar thickened liquids.  Dementia Continue Aricept and Namenda  HTN Does not appear to be on BP medications at home.  Deconditioning: Family agreeable  to short-term skilled nursing facility placement  Code Status: DNR Family Communication: wifeat bedside Disposition Plan: SNF tomorrow if stable   Consultants:  IR  Procedures:  Thoracentesis  HPI/Subjective: Feels a little short of breath, but confused. Wife has not noted any breathing difficulties.  Objective: Filed Vitals:   08/15/14 0627  BP: 112/55  Pulse: 65  Temp: 97.7 F (36.5 C)  Resp: 18    Intake/Output Summary (Last 24 hours) at 08/15/14 1319 Last data filed at 08/15/14 0951  Gross per 24 hour  Intake    867 ml  Output   1000 ml  Net   -133 ml   Filed Weights   08/12/14 0600 08/14/14 1500 08/15/14 0551  Weight: 63.6 kg (140 lb 3.4 oz) 65.6 kg (144 lb 10 oz) 61.3 kg (135 lb 2.3 oz)    Exam:   General:  Pleasant/cooperative. Confused in chair. Eating lunch voraciously. Breathing nonlabored  Cardiovascular: rrr without murmurs gallops rubs  Respiratory: diminished right side, no wheezing rhonchi or rales  Abdomen: +BS, soft nontender nondistended  Musculoskeletal: no edema no clubbing or cyanosis  Data Reviewed: Basic Metabolic Panel:  Recent Labs Lab 08/11/14 1035 08/12/14 1034 08/13/14 0717 08/14/14 0555  NA 138 134* 135 136  K 3.9 3.9 3.9 3.8  CL 104 102 101 101  CO2 GLUCOSE 100* 134* 107* 80  BUN 27* 22 28* 26*  CREATININE 1.43* 1.12 1.19 1.25  CALCIUM 8.5 8.3* 8.3* 8.4   Liver Function Tests:  Recent Labs Lab 08/11/14 1035  AST 28  ALT 24  ALKPHOS 125*  BILITOT 1.2  PROT 7.2  ALBUMIN 2.8*   No results for input(s): LIPASE, AMYLASE in the last 168 hours. No results for input(s): AMMONIA in the last 168 hours. CBC:  Recent  Labs Lab 08/11/14 1035 08/13/14 0717 08/14/14 0555  WBC 6.7 8.1 7.1  NEUTROABS 5.1  --   --   HGB 12.5* 11.7* 11.8*  HCT 38.1* 35.2* 35.2*  MCV 100.3* 97.2 97.2  PLT 243 209 213   Cardiac Enzymes: No results for input(s): CKTOTAL, CKMB, CKMBINDEX, TROPONINI in the last 168  hours. BNP (last 3 results)  Recent Labs  08/11/14 1035  BNP 320.9*    ProBNP (last 3 results) No results for input(s): PROBNP in the last 8760 hours.  CBG: No results for input(s): GLUCAP in the last 168 hours.  Recent Results (from the past 240 hour(s))  Culture, Urine     Status: None   Collection Time: 08/11/14  6:09 PM  Result Value Ref Range Status   Specimen Description URINE, CLEAN CATCH  Final   Special Requests NONE  Final   Colony Count NO GROWTH Performed at [facility]   Final   Culture NO GROWTH Performed at [facility]   Final   Report Status 08/13/2014 FINAL  Final  Body fluid culture     Status: None (Preliminary result)   Collection Time: 08/13/14 12:46 PM  Result Value Ref Range Status   Specimen Description FLUID RIGHT PLEURAL  Final   Special Requests NONE  Final   Gram Stain   Final    NO WBC SEEN NO ORGANISMS SEEN Performed at [facility]    Culture   Final    NO GROWTH 1 DAY Performed at [facility]    Report Status PENDING  Incomplete     Studies: Ct Chest W Contrast  08/13/2014   CLINICAL DATA:  Shortness of breath. Evaluate right pleural effusion. Current history of atrial fibrillation.  EXAM: CT CHEST WITH CONTRAST  TECHNIQUE: Multidetector CT imaging of the chest was performed during intravenous contrast administration.  CONTRAST:  75mL OMNIPAQUE IOHEXOL 300 MG/ML IV.  COMPARISON:  No prior CT. Multiple prior chest x-rays, most recently earlier same date.  FINDINGS: Lungs: Airspace consolidation in the right lower lobe with a "drowned lung" appearance. Numerous calcified granulomata are present within the consolidated lung. Mild ground-glass airspace opacity is present in the superior segment right lower lobe, best seen on the coronal reformatted images. Dense airspace consolidation is present in the left lower lobe. Minimal ground-glass airspace opacities are present in the left upper lobe. No visible  pulmonary parenchymal nodules or masses, though the consolidation in the right lower lobe has a masslike appearance.  Trachea/bronchi: Central airways patent without significant bronchial wall thickening. Calcification involving the tracheobronchial cartilages.  Pleura: Moderately large right pleural effusion which is partially loculated. Slight pleural enhancement on the right with the Hounsfield measurements of the pleural fluid approximating 20. Small simple left pleural effusion (Hounsfield measurement 3) without evidence of pleural enhancement.  Mediastinum:  No mediastinal masses.  Heart and Vascular: Heart enlarged with massive right atrial enlargement and moderate left atrial enlargement. Small pericardial effusion. Extensive 3 vessel coronary atherosclerosis. Moderate to severe atherosclerosis involving the thoracic and upper abdominal aorta without aneurysm. Atherosclerosis involving the proximal great vessels with a stenosis involving the proximal left subclavian artery which does not appear hemodynamically significant.  Lymphatic: No pathologic mediastinal, hilar or axillary lymphadenopathy.  Lower neck: Atrophic thyroid gland. No supraclavicular lymphadenopathy.  Visualized upper abdomen: Blurred by respiratory motion. Reflux of contrast into the IVC and the hepatic veins. Small hiatal hernia.  Musculoskeletal: Multiple osteoporotic compression fractures including T6, T9, T10, T 11,  T12, L1 and L2, worst at T12 and L1, with associated kyphous deformity.  Other findings:  Mild bilateral gynecomastia.  IMPRESSION: 1. Moderately large right pleural effusion with enhancement of the pleura and increased attenuation of the pleural fluid. 2. Dense airspace consolidation involving the right lower lobe and right middle lobe with "drowned lung" appearance. Therefore, the right pleural effusion is likely a parapneumonic effusion and the enhancement of the pleura and partial loculation may indicate a developing  empyema. 3. Small simple left pleural effusion and associated passive atelectasis versus pneumonia in the left lower lobe. 4. Patchy pneumonia involving the superior segment right lower lobe and the left upper lobe. 5. Cardiomegaly with massive right atrial enlargement and moderate left atrial enlargement. Reflux of contrast into the IVC and hepatic veins is consistent with right heart failure or tricuspid regurgitation.   Electronically Signed   By: Hulan Saas M.D.   On: 08/13/2014 21:05   Echocardiogram Left ventricle: The cavity size was normal. Wall thickness was increased in a pattern of moderate LVH. Systolic function was normal. The estimated ejection fraction was in the range of 55% to 60%. Wall motion was normal; there were no regional wall motion abnormalities. - Aortic valve: Cusp separation was reduced. There was mild to moderate stenosis. Valve area (VTI): 0.87 cm^2. Valve area (Vmax): 0.75 cm^2. Valve area (Vmean): 0.85 cm^2. - Mitral valve: Mildly calcified annulus. - Left atrium: The atrium was severely dilated. - Right ventricle: The cavity size was moderately to severely dilated. Wall thickness was normal. - Right atrium: The atrium was moderately to severely dilated. - Tricuspid valve: There was moderate regurgitation. - Pulmonary arteries: Systolic pressure was moderately to severely increased. PA peak pressure: 62 mm Hg (S).  Scheduled Meds: . aspirin  325 mg Oral Daily  . clindamycin (CLEOCIN) IV  600 mg Intravenous 3 times per day  . docusate sodium  100 mg Oral BID  . donepezil  10 mg Oral QHS  . doxycycline  100 mg Oral Q12H  . dutasteride  0.5 mg Oral QHS  . feeding supplement (ENSURE ENLIVE)  237 mL Oral BID BM  . furosemide  40 mg Oral Daily  . memantine  10 mg Oral BID  . sertraline  25 mg Oral Daily  . sodium chloride  3 mL Intravenous Q12H  . tamsulosin  0.4 mg Oral QHS   Continuous Infusions:  Antibiotics Given (last 72 hours)     Date/Time Action Medication Dose Rate   08/14/14 1430 Given   azithromycin (ZITHROMAX) tablet 500 mg 500 mg    08/14/14 1534 Given   clindamycin (CLEOCIN) IVPB 600 mg 600 mg 100 mL/hr   08/14/14 1534 Given   doxycycline (VIBRA-TABS) tablet 100 mg 100 mg    08/14/14 2216 Given   clindamycin (CLEOCIN) IVPB 600 mg 600 mg 100 mL/hr   08/14/14 2238 Given   doxycycline (VIBRA-TABS) tablet 100 mg 100 mg    08/15/14 0547 Given   clindamycin (CLEOCIN) IVPB 600 mg 600 mg 100 mL/hr   08/15/14 1008 Given   doxycycline (VIBRA-TABS) tablet 100 mg 100 mg       Principal Problem:   Pleural effusion Active Problems:   Elevated serum creatinine   Dementia   TIA (transient ischemic attack)   Bilateral carotid artery disease   Atrial fibrillation   Benign positional vertigo   Elevated INR   Dyspnea on exertion    Time spent: 35 min including reviewing records, personally reviewing CAT scan chest  x-ray, talking to family and discussing with Dr. Jiles CrockerGates    Jennine Peddy L  [facility] Pager [phone] [email or web address], password [email or web address] 08/15/2014, 1:19 PM  LOS: 4 days

## 2014-08-15 NOTE — Social Work (Signed)
CSW met with patient's wife to follow up on bed choice. Patient's wife confirmed bed choice with [facility]. This CSW contacted facility to make them aware. CSW will coordinate discharge once medically clear.  This CSW provided patient's with additional support and direction to help her manage the transition from hospital to SNF.  CSW will continue to follow.  Christene Lye MSW, [place]

## 2014-08-16 LAB — LEGIONELLA ANTIGEN, URINE

## 2014-08-16 MED ORDER — ENSURE ENLIVE PO LIQD: 237.0000 mL | Freq: Two times a day (BID) | ORAL | Status: AC

## 2014-08-16 MED ORDER — MOXIFLOXACIN HCL 400 MG PO TABS: 400.0000 mg | ORAL_TABLET | Freq: Every day | ORAL | Status: DC

## 2014-08-16 MED ORDER — ASPIRIN 325 MG PO TABS: 325.0000 mg | ORAL_TABLET | Freq: Every day | ORAL | Status: DC

## 2014-08-16 MED ORDER — BENZONATATE 100 MG PO CAPS: 100.0000 mg | ORAL_CAPSULE | Freq: Three times a day (TID) | ORAL | Status: DC | PRN

## 2014-08-16 MED ORDER — ACETAMINOPHEN 325 MG PO TABS: 650.0000 mg | ORAL_TABLET | Freq: Four times a day (QID) | ORAL | Status: AC | PRN

## 2014-08-16 NOTE — Clinical Social Work Note (Signed)
Clinical Social Worker facilitated patient discharge including contacting patient family (left message with wife, will also check at bedside) and facility to confirm patient discharge plans.  Clinical information faxed to facility and family agreeable with plan.  [facility]  RN to call report prior to discharge.  DC packet currently being prepared and to be placed on chart for transport.   Clinical Social Worker will sign off for now as social work intervention is no longer needed. Please consult us again if new need arises.  Derenda FennelBashira Mamie Diiorio, MSW, LCSWA [phone] 08/16/2014 10:41 AM

## 2014-08-16 NOTE — Progress Notes (Signed)
OT Cancellation Note  Patient Details Name: Francis BeersZeb N Tygart MRN: [id number] DOB: 12/18/1924   Cancelled Treatment:    Reason Eval/Treat Not Completed: OT screened. Pt is Medicare and current D/C plan is SNF (spoke with nurse and she thinks pt plans to leave for SNF today). No apparent immediate acute care OT needs, therefore will defer OT to SNF. If OT eval is needed please call Acute Rehab Dept. at [phone] or text page OT at [phone].    Earlie RavelingStraub, Summerlynn Glauser L OTR/L [phone] 08/16/2014, 10:14 AM

## 2014-08-16 NOTE — Clinical Documentation Improvement (Signed)
Right - sided heart failure documented following ECHO.   Patient with EF of 55-60%, PA pressures of 63, pulmonary hypertension  PO Lasix 40mg  daily ordered  Please further specify the type and acuity of CHF your patient has and document findings in next progress note and include in discharge summary if applicable.   Type: Systolic, Diastolic, Systolic and Diastolic  Acuity: Acute, Chronic, Acute on Chronic  Other Condition________________________________________ Cannot Clinically Determine  Thank You, Shellee MiloEileen T Tamlyn Sides ,RN Clinical Documentation Specialist:  [phone]  [facility]- Health Information Management

## 2014-08-16 NOTE — Discharge Summary (Signed)
Physician Discharge Summary  Francis Thomas ZOX:[id number] DOB: [birth year] DOA: 08/11/2014  PCP: Ginette Otto, MD  Admit date: 08/11/2014 Discharge date: 08/16/2014  Time spent: 30 minutes  Recommendations for Outpatient Follow-up:  1. Would  avoid invasive procedures including repeat thoracentesis unless significantly symptomatic. 2. to short-term skilled nursing facility 3. Cultures from thoracentesis still pending at discharge. Cytology from thoracentesis pending at discharge.  Discharge Diagnoses:  Principal Problem:   Pleural effusion, transudative, likely parapneumonic Active Problems: Bilateral pneumonia, possibly aspiration related   Elevated serum creatinine   Dementia   Atrial fibrillation   Elevated INR  right heart failure with pulmonary hypertension  Deconditioning  Discharge Condition: stable   Diet recommendation: heart healthy soft with thin liquids, aspiration precautions. Meds whole in puree  Code status: DNR  Filed Weights   08/14/14 1500 08/15/14 0551 08/16/14 0515  Weight: 65.6 kg (144 lb 10 oz) 61.3 kg (135 lb 2.3 oz) 61.4 kg (135 lb 5.8 oz)    History of present illness:  81 year old with past medical history of A. Fib on Coumadin , and mild dementia the comes into the hospital because of shortness of breath. Most of the history is provided by the wife as patient cannot remember She relates that he fell 2 weeks ago And hit his shoulder x-rays were done with a fracture. At that time he was also diagnosed a TIA and Coumadin was continued. But the wife relates that for the last 24 hours and shortness of breath has progressively gotten worse he can go up stairs or walk due to the shortness of breath. She denies he's had any fever , chest pain or vomiting. In the ED: A chest x-ray was done that shows complete opacification of the right lung. , INR supratherapeutic at 4.2. I agree with consulting IR for thoracocentesis and send light's criteria (LDH  and protein) and cell count. I am very concerned about hemothorax due to trauma, hold vit Coumadin. 2-D echo. Continue Aricept and dementia.  Hospital Course:  Patient was admitted to the hospitalist service. Pulmonary was contacted by admitting team. They recommended IR to tap but no need take get involved on last exudative effusion or nondiagnostic. IR removed 500 mL of blood-tinged fluid. Indices indicative of transudate. Gram stain showed no organisms and culture is negative to date. CAT scan done after thoracentesis showed patchy bilateral pneumonia. Patient reportedly had coughing with eating and "choking spells. Suspect this may be aspiration related. Patient is penicillin allergic. Started on clindamycin and doxycycline.  Will change to Avelox at discharge which has some anaerobic coverage as well as atypicals and strep. He felt better after thoracentesis. Repeat chest x-ray shows continued effusion which is not surprising. Speech therapy consulted and recommended initially dysphagia 3 diet with nectar thickened liquids but has advance diet to thin liquids with dysphagia 3 diet. Medications whole in pure were recommended.  Patient has normal oxygen saturations and breathing is nonlabored by the time of discharge.  Cytology is pending and will need to be followed up. Family would like to avoid invasive procedures and therefore would not re-tap, or refer for chest tube or VATS procedure unless he became extremely symptomatic. He carries a diagnosis of dementia and his CODE STATUS is DO NOT RESUSCITATE.  Echo showed normal left ventricular EF, but moderate to severe right heart dilatation with pulmonary hypertension, pulmonary artery pressure is 62 mmHg. Likely contributing to effusion. Lasix was continued.    Patient has a history of dementia and was  quite impulsive but otherwise easily redirected. His dementia medications were continued.  Given the fact that he had blood-tinged pleural effusion,  recent fall, dementia, and worsening of the effusion after institution of Coumadin, the benefit of Coumadin is outweighed by its risk. I discussed the case with Dr. Kevan NyGates who knows the patient and agrees. We have stopped Coumadin and place patient on aspirin for stroke prophylaxis.  Patient has worked with physical therapy and will benefit from short-term skilled nursing facility.  Procedures:  Left thoracentesis  Consultations:  Interventional radiology  Discharge Exam: Filed Vitals:   08/16/14 0515  BP: 118/55  Pulse: 113  Temp: 98.2 F (36.8 C)  Resp: 17    General: Comfortable lying flat. Confused. Cardiovascular: Irregularly irregular Respiratory: Diminished on left with rales no wheezes She remedies no clubbing cyanosis or edema  Discharge Instructions   Discharge Instructions    Diet - low sodium heart healthy    Complete by:  As directed      Walk with assistance    Complete by:  As directed           Current Discharge Medication List    START taking these medications   Details  acetaminophen (TYLENOL) 325 MG tablet Take 2 tablets (650 mg total) by mouth every 6 (six) hours as needed for moderate pain (headache).    aspirin 325 MG tablet Take 1 tablet (325 mg total) by mouth daily.    benzonatate (TESSALON) 100 MG capsule Take 1 capsule (100 mg total) by mouth 3 (three) times daily as needed for cough. Qty: 20 capsule, Refills: 0    feeding supplement, ENSURE ENLIVE, (ENSURE ENLIVE) LIQD Take 237 mLs by mouth 2 (two) times daily between meals. Qty: 237 mL, Refills: 12    moxifloxacin (AVELOX) 400 MG tablet Take 1 tablet (400 mg total) by mouth daily at 8 pm through 4/30.      CONTINUE these medications which have NOT CHANGED   Details  cholecalciferol (VITAMIN D) 1000 UNITS tablet Take 1,000 Units by mouth daily.    co-enzyme Q-10 50 MG capsule Take 100 mg by mouth daily.    donepezil (ARICEPT) 10 MG tablet Take 10 mg by mouth at bedtime.     dutasteride (AVODART) 0.5 MG capsule Take 0.5 mg by mouth at bedtime.     furosemide (LASIX) 40 MG tablet Take 40 mg by mouth every morning.    memantine (NAMENDA) 10 MG tablet Take 10 mg by mouth 2 (two) times daily.    sertraline (ZOLOFT) 25 MG tablet Take 25 mg by mouth daily.    tamsulosin (FLOMAX) 0.4 MG CAPS capsule Take 0.4 mg by mouth at bedtime.      STOP taking these medications     CALCIUM PO      IRON PO      OVER THE COUNTER MEDICATION      warfarin (COUMADIN) 3 MG tablet        Allergies  Allergen Reactions  . Penicillin G Benzathine   . Scopolamine Base [Scopolamine]   . Skelaxin [Metaxalone]     Confusion  . Sulfa Antibiotics       The results of significant diagnostics from this hospitalization (including imaging, microbiology, ancillary and laboratory) are listed below for reference.    Significant Diagnostic Studies: Dg Chest 1 View  08/13/2014   CLINICAL DATA:  Status post right-sided thoracentesis  EXAM: CHEST  1 VIEW  COMPARISON:  Chest x-ray of August 12, 2014.  FINDINGS:  The there has been interval removal of a portion of the pleural effusion on the right. A moderate amount of fluid remains. There is no postprocedure pneumothorax. There is chronic mediastinal shift towards the right. The left lung is clear. There is a small pleural effusion at the left lung base.  IMPRESSION: There is no postprocedure complication following right-sided thoracentesis. The volume of pleural fluid present has decreased somewhat.   Electronically Signed   By: David  [place] M.D.   On: 08/13/2014 13:01   Ct Chest W Contrast  08/13/2014   CLINICAL DATA:  Shortness of breath. Evaluate right pleural effusion. Current history of atrial fibrillation.  EXAM: CT CHEST WITH CONTRAST  TECHNIQUE: Multidetector CT imaging of the chest was performed during intravenous contrast administration.  CONTRAST:  75mL OMNIPAQUE IOHEXOL 300 MG/ML IV.  COMPARISON:  No prior CT. Multiple prior  chest x-rays, most recently earlier same date.  FINDINGS: Lungs: Airspace consolidation in the right lower lobe with a "drowned lung" appearance. Numerous calcified granulomata are present within the consolidated lung. Mild ground-glass airspace opacity is present in the superior segment right lower lobe, best seen on the coronal reformatted images. Dense airspace consolidation is present in the left lower lobe. Minimal ground-glass airspace opacities are present in the left upper lobe. No visible pulmonary parenchymal nodules or masses, though the consolidation in the right lower lobe has a masslike appearance.  Trachea/bronchi: Central airways patent without significant bronchial wall thickening. Calcification involving the tracheobronchial cartilages.  Pleura: Moderately large right pleural effusion which is partially loculated. Slight pleural enhancement on the right with the Hounsfield measurements of the pleural fluid approximating 20. Small simple left pleural effusion (Hounsfield measurement 3) without evidence of pleural enhancement.  Mediastinum:  No mediastinal masses.  Heart and Vascular: Heart enlarged with massive right atrial enlargement and moderate left atrial enlargement. Small pericardial effusion. Extensive 3 vessel coronary atherosclerosis. Moderate to severe atherosclerosis involving the thoracic and upper abdominal aorta without aneurysm. Atherosclerosis involving the proximal great vessels with a stenosis involving the proximal left subclavian artery which does not appear hemodynamically significant.  Lymphatic: No pathologic mediastinal, hilar or axillary lymphadenopathy.  Lower neck: Atrophic thyroid gland. No supraclavicular lymphadenopathy.  Visualized upper abdomen: Blurred by respiratory motion. Reflux of contrast into the IVC and the hepatic veins. Small hiatal hernia.  Musculoskeletal: Multiple osteoporotic compression fractures including T6, T9, T10, T 11, T12, L1 and L2, worst at  T12 and L1, with associated kyphous deformity.  Other findings:  Mild bilateral gynecomastia.  IMPRESSION: 1. Moderately large right pleural effusion with enhancement of the pleura and increased attenuation of the pleural fluid. 2. Dense airspace consolidation involving the right lower lobe and right middle lobe with "drowned lung" appearance. Therefore, the right pleural effusion is likely a parapneumonic effusion and the enhancement of the pleura and partial loculation may indicate a developing empyema. 3. Small simple left pleural effusion and associated passive atelectasis versus pneumonia in the left lower lobe. 4. Patchy pneumonia involving the superior segment right lower lobe and the left upper lobe. 5. Cardiomegaly with massive right atrial enlargement and moderate left atrial enlargement. Reflux of contrast into the IVC and hepatic veins is consistent with right heart failure or tricuspid regurgitation.   Electronically Signed   By: Hulan Saas M.D.   On: 08/13/2014 21:05   [name]  08/12/2014   CLINICAL DATA:  Pleural effusion and worsening shortness of breath.  EXAM: PORTABLE CHEST - 1 VIEW  COMPARISON:  08/11/2014  FINDINGS: Exam again demonstrates moderate opacification over the right mid to lower lung likely moderate size effusion with associated atelectasis. Cannot exclude infection in the right lung. Prominent right hilar density unchanged which may be due to mass or adenopathy. Left lung is clear. Remainder the exam is unchanged.  IMPRESSION: Moderate size right pleural effusion without significant change. Opacification over the right mid to lower lung which may be due to associated atelectasis or infection. Prominent right hilar density as cannot exclude adenopathy or mass. Recommend CT with contrast for further evaluation.   Electronically Signed   By: Elberta Fortis M.D.   On: 08/12/2014 21:20   [name]  08/11/2014   CLINICAL DATA:  Progressive shortness of  breath.  EXAM: PORTABLE CHEST - 1 VIEW  COMPARISON:  01/20/2014  FINDINGS: There is a large right pleural effusion, increased since the prior exam. There is fullness in the right hilum which could represent atelectasis but the possibility of a mass should be considered.  Minimal atelectasis at the left lung base. Pulmonary vascularity is normal.  No acute osseous abnormality.  IMPRESSION: Marked increase in the right pleural effusion. Fullness on the right hilum may be due to atelectasis or fluid but the possibility of a mass should be considered.   Electronically Signed   By: Francene Boyers M.D.   On: 08/11/2014 11:19   US Thoracentesis Asp Pleural Space W/img Guide  08/13/2014   INDICATION: Symptomatic right sided pleural effusion  EXAM: US THORACENTESIS ASP PLEURAL SPACE W/IMG GUIDE  COMPARISON:  CXR 08/12/14.  MEDICATIONS: None  COMPLICATIONS: None immediate  TECHNIQUE: Informed written consent was obtained from the patient after a discussion of the risks, benefits and alternatives to treatment. A timeout was performed prior to the initiation of the procedure.  Initial ultrasound scanning demonstrates a right pleural effusion. The lower chest was prepped and draped in the usual sterile fashion. 1% lidocaine was used for local anesthesia.  Under direct ultrasound guidance, a 19 gauge, 7-cm, Yueh catheter was introduced. An ultrasound image was saved for documentation purposes. The thoracentesis was performed. The catheter was removed and a dressing was applied. The patient tolerated the procedure well without immediate post procedural complication. The patient was escorted to have an upright chest radiograph.  FINDINGS: A total of approximately 500 ml of blood tinged fluid was removed. Requested samples were sent to the laboratory.  IMPRESSION: Successful ultrasound-guided right sided thoracentesis yielding 500 ml of pleural fluid.  Read By:  Pattricia Boss PA-C   Electronically Signed   By: Richarda Overlie M.D.    On: 08/13/2014 12:25   Echo Left ventricle: The cavity size was normal. Wall thickness was increased in a pattern of moderate LVH. Systolic function was normal. The estimated ejection fraction was in the range of 55% to 60%. Wall motion was normal; there were no regional wall motion abnormalities. - Aortic valve: Cusp separation was reduced. There was mild to moderate stenosis. Valve area (VTI): 0.87 cm^2. Valve area (Vmax): 0.75 cm^2. Valve area (Vmean): 0.85 cm^2. - Mitral valve: Mildly calcified annulus. - Left atrium: The atrium was severely dilated. - Right ventricle: The cavity size was moderately to severely dilated. Wall thickness was normal. - Right atrium: The atrium was moderately to severely dilated. - Tricuspid valve: There was moderate regurgitation. - Pulmonary arteries: Systolic pressure was moderately to severely increased. PA peak pressure: 62 mm Hg (S).  Microbiology: Recent Results (from the past 240 hour(s))  Culture,  Urine     Status: None   Collection Time: 08/11/14  6:09 PM  Result Value Ref Range Status   Specimen Description URINE, CLEAN CATCH  Final   Special Requests NONE  Final   Colony Count NO GROWTH Performed at [facility]   Final   Culture NO GROWTH Performed at [facility]   Final   Report Status 08/13/2014 FINAL  Final  Body fluid culture     Status: None (Preliminary result)   Collection Time: 08/13/14 12:46 PM  Result Value Ref Range Status   Specimen Description FLUID RIGHT PLEURAL  Final   Special Requests NONE  Final   Gram Stain   Final    NO WBC SEEN NO ORGANISMS SEEN Performed at [facility]    Culture   Final    NO GROWTH 1 DAY Performed at [facility]    Report Status PENDING  Incomplete     Labs: Basic Metabolic Panel:  Recent Labs Lab 08/11/14 1035 08/12/14 1034 08/13/14 0717 08/14/14 0555  NA 138 134* 135 136  K 3.9 3.9 3.9 3.8  CL 104 102 101 101  CO2 27  24 26 25   GLUCOSE 100* 134* 107* 80  BUN 27* 22 28* 26*  CREATININE 1.43* 1.12 1.19 1.25  CALCIUM 8.5 8.3* 8.3* 8.4   Liver Function Tests:  Recent Labs Lab 08/11/14 1035  AST 28  ALT 24  ALKPHOS 125*  BILITOT 1.2  PROT 7.2  ALBUMIN 2.8*   No results for input(s): LIPASE, AMYLASE in the last 168 hours. No results for input(s): AMMONIA in the last 168 hours. CBC:  Recent Labs Lab 08/11/14 1035 08/13/14 0717 08/14/14 0555  WBC 6.7 8.1 7.1  NEUTROABS 5.1  --   --   HGB 12.5* 11.7* 11.8*  HCT 38.1* 35.2* 35.2*  MCV 100.3* 97.2 97.2  PLT 243 209 213   Cardiac Enzymes: No results for input(s): CKTOTAL, CKMB, CKMBINDEX, TROPONINI in the last 168 hours. BNP: BNP (last 3 results)  Recent Labs  08/11/14 1035  BNP 320.9*    ProBNP (last 3 results) No results for input(s): PROBNP in the last 8760 hours.  CBG: No results for input(s): GLUCAP in the last 168 hours.     SignedChristiane Ha  [facility] 08/16/2014, 10:02 AM

## 2014-08-16 NOTE — Clinical Documentation Improvement (Signed)
Presented with right pleural effusion; thoracentesis performed which revealed no malignancy but probable bilateral pneumonia and parapneumonic effusion; possible aspiration pneumonia.   Nutritional Consult on 4/21 reveals severe malnutrition with severe muscle and fat depletion, BMI of 20; recommendations of continuing Ensure Enlive BID  Please assess Nutritional Consult and render an opinion in next progress note and include in discharge summary if applicable.  Other Condition Cannot clinically determine  Thank You, Shellee MiloEileen T Brixton Franko ,RN Clinical Documentation Specialist:  [phone]  [facility]

## 2014-08-16 NOTE — Clinical Documentation Improvement (Signed)
PLEASE NOTE IF PRESENT ON ADMISSION  Stage 2 pressure ulcer to right hip documented in 4/21 Nutritional Consult.  Please clarify if you agree with assessment and document findings in next progress note and include in discharge summary if applicable.  Stage  I  Pressure Ulcer   (reddening of the skin) Stage  II Pressure Ulcer  (blister open or unopened) Stage  III Pressure Ulcer (through all layers skin) Stage IV Pressure Ulcer   (through skin & underlying  muscle, tendons, and bones) Other Condition Cannot Clinically Determine   Thank You, Shellee MiloEileen T Driana Dazey ,RN Clinical Documentation Specialist:  [phone]  [facility]

## 2014-08-17 LAB — BODY FLUID CULTURE
Culture: NO GROWTH
GRAM STAIN: NONE SEEN

## 2014-08-18 LAB — OTHER BODY FLUID CHEMISTRY

## 2014-09-02 ENCOUNTER — Other Ambulatory Visit: Payer: Self-pay | Admitting: *Deleted

## 2014-09-02 ENCOUNTER — Encounter: Payer: Self-pay | Admitting: Thoracic Surgery (Cardiothoracic Vascular Surgery)

## 2014-09-02 ENCOUNTER — Ambulatory Visit
Admission: RE | Admit: 2014-09-02 | Discharge: 2014-09-02 | Disposition: A | Discharge status: Home / Self Care | Payer: Medicare Other | Source: Ambulatory Visit | Attending: Thoracic Surgery (Cardiothoracic Vascular Surgery) | Admitting: Thoracic Surgery (Cardiothoracic Vascular Surgery)

## 2014-09-02 ENCOUNTER — Institutional Professional Consult (permissible substitution) (INDEPENDENT_AMBULATORY_CARE_PROVIDER_SITE_OTHER): Payer: Medicare Other | Admitting: Thoracic Surgery (Cardiothoracic Vascular Surgery)

## 2014-09-02 VITALS — BP 135/70 | HR 62 | Resp 18 | Ht 69.0 in | Wt 143.0 lb

## 2014-09-02 DIAGNOSIS — J90 Pleural effusion, not elsewhere classified: Secondary | ICD-10-CM

## 2014-09-02 DIAGNOSIS — J948 Other specified pleural conditions: Secondary | ICD-10-CM | POA: Diagnosis not present

## 2014-09-02 MED ORDER — PREDNISONE 10 MG PO TABS: ORAL_TABLET | ORAL | Status: DC

## 2014-09-02 NOTE — Progress Notes (Signed)
PCP is Francis Thomas,Francis THOMAS, MD Referring Provider is Francis Thomas, Hal, MD  Chief Complaint  Patient presents with  . Pleural Effusion    right ..had CT CHEST AND thoracentesis on 08/13/14    HPI: 81 year old man sent for consultation regarding a right pleural effusion.  Francis Thomas is an 81 year old man with multiple medical problems including atrial fibrillation, recent TIA, recent pneumonia, congestive heart failure, moderate aortic stenosis, depression, protein calorie malnutrition, unspecified muscle weakness, and early dementia. He has a right pleural effusion dating back at least as far as April 2015. In April he was hospitalized after a fall with shortness of breath. A chest x-ray showed worsening right pleural effusion. He had a thoracentesis which showed a mixed picture with a protein of less than 3, glucose of 71 and LDH of 92. There are 274 white blood cells, 71% lymphocytes. Cytology was negative. The fluid was noted to be blood tinged. Of note he was on Coumadin prior to that admission.  He was evaluated and treated for pneumonia. He had symptomatic improvement after the fluid was drained. [facility] He feels that his breathing has been fairly stable since discharge. His wife thinks he may be getting a little more short of breath. He had a chest x-ray(I am unable to view) that apparently showed an increase in the size of the effusion again.  Since this episode in April he is been unable to return home. His wife says that he is very fatigued and has a difficult time moving. She thinks he is working harder to breathe. His Coumadin was discontinued.      Past Medical History  Diagnosis Date  . Atrial fibrillation   . Osteoporosis   . Benign positional vertigo   . Fatty liver   . Vitamin D deficiency   . Peripheral neuropathy   . Bilateral carotid artery disease   . Gilbert's syndrome     Mild  . Depression   . TIA (transient ischemic attack)     Past  Surgical History  Procedure Laterality Date  . Colonoscopy      No family history on file.  Social History History  Substance Use Topics  . Smoking status: Never Smoker   . Smokeless tobacco: Never Used  . Alcohol Use: Yes     Comment: Occasional Glass of wine or beer    Current Outpatient Prescriptions  Medication Sig Dispense Refill  . acetaminophen (TYLENOL) 325 MG tablet Take 2 tablets (650 mg total) by mouth every 6 (six) hours as needed for moderate pain (headache).    Marland Kitchen. aspirin 325 MG tablet Take 1 tablet (325 mg total) by mouth daily.    . benzonatate (TESSALON) 100 MG capsule Take 1 capsule (100 mg total) by mouth 3 (three) times daily as needed for cough. 20 capsule 0  . cholecalciferol (VITAMIN D) 1000 UNITS tablet Take 1,000 Units by mouth daily.    Marland Kitchen. donepezil (ARICEPT) 10 MG tablet Take 10 mg by mouth at bedtime.    . dutasteride (AVODART) 0.5 MG capsule Take 0.5 mg by mouth at bedtime.     . feeding supplement, ENSURE ENLIVE, (ENSURE ENLIVE) LIQD Take 237 mLs by mouth 2 (two) times daily between meals. 237 mL 12  . finasteride (PROSCAR) 5 MG tablet Take 5 mg by mouth at bedtime.    . furosemide (LASIX) 40 MG tablet Take 40 mg by mouth every morning.    . memantine (NAMENDA) 10 MG tablet Take 10 mg by  mouth 2 (two) times daily.    . sertraline (ZOLOFT) 25 MG tablet Take 25 mg by mouth daily.    . tamsulosin (FLOMAX) 0.4 MG CAPS capsule Take 0.4 mg by mouth at bedtime.    Marland Kitchen. co-enzyme Q-10 50 MG capsule Take 100 mg by mouth daily.    . predniSONE (DELTASONE) 10 MG tablet Take 5 tablets PO on day 1, 4 tablets on day 2, 3 tablets on day 3, 2 tablets on day 4, 1 tablet on day 5 15 tablet 0   No current facility-administered medications for this visit.    Allergies  Allergen Reactions  . Penicillin G Benzathine   . Scopolamine Base [Scopolamine]   . Skelaxin [Metaxalone]     Confusion  . Sulfa Antibiotics     Review of Systems  Constitutional: Positive for  activity change and fatigue.  HENT: Positive for hearing loss.   Respiratory: Positive for cough, choking (aspiration treated by speech path) and shortness of breath. Negative for wheezing.   Cardiovascular: Negative for chest pain and leg swelling.       Atrial fibrillation  Musculoskeletal: Positive for back pain and gait problem.  Neurological:       Memory problems, recent TIA  Hematological: Bruises/bleeds easily.  All other systems reviewed and are negative.   BP 135/70 mmHg  Pulse 62  Resp 18  Ht 5\' 9"  (1.753 m)  Wt 143 lb (64.864 kg)  BMI 21.11 kg/m2  SpO2 99% Physical Exam  Constitutional: He is oriented to person, place, and time. He appears well-developed and well-nourished.  HENT:  Head: Normocephalic and atraumatic.  Eyes: EOM are normal.  Cardiovascular:  Murmur (faint) heard. Irregularly irregular  Pulmonary/Chest: He has no wheezes.  Decreased BS right base  Abdominal: Soft. There is no tenderness.  Musculoskeletal: He exhibits no edema.  kyphosis  Lymphadenopathy:    He has no cervical adenopathy.  Neurological: He is alert and oriented to person, place, and time.  Very hard of hearing Moves all 4 extermities  Skin: Skin is warm and dry.  Vitals reviewed.    Diagnostic Tests: CT CHEST WITH CONTRAST  TECHNIQUE: Multidetector CT imaging of the chest was performed during intravenous contrast administration.  CONTRAST: 75mL OMNIPAQUE IOHEXOL 300 MG/ML IV.  COMPARISON: No prior CT. Multiple prior chest x-rays, most recently earlier same date.  FINDINGS: Lungs: Airspace consolidation in the right lower lobe with a "drowned lung" appearance. Numerous calcified granulomata are present within the consolidated lung. Mild ground-glass airspace opacity is present in the superior segment right lower lobe, best seen on the coronal reformatted images. Dense airspace consolidation is present in the left lower lobe. Minimal ground-glass  airspace opacities are present in the left upper lobe. No visible pulmonary parenchymal nodules or masses, though the consolidation in the right lower lobe has a masslike appearance.  Trachea/bronchi: Central airways patent without significant bronchial wall thickening. Calcification involving the tracheobronchial cartilages.  Pleura: Moderately large right pleural effusion which is partially loculated. Slight pleural enhancement on the right with the Hounsfield measurements of the pleural fluid approximating 20. Small simple left pleural effusion (Hounsfield measurement 3) without evidence of pleural enhancement.  Mediastinum: No mediastinal masses.  Heart and Vascular: Heart enlarged with massive right atrial enlargement and moderate left atrial enlargement. Small pericardial effusion. Extensive 3 vessel coronary atherosclerosis. Moderate to severe atherosclerosis involving the thoracic and upper abdominal aorta without aneurysm. Atherosclerosis involving the proximal great vessels with a stenosis involving the proximal left subclavian artery  which does not appear hemodynamically significant.  Lymphatic: No pathologic mediastinal, hilar or axillary lymphadenopathy.  Lower neck: Atrophic thyroid gland. No supraclavicular lymphadenopathy.  Visualized upper abdomen: Blurred by respiratory motion. Reflux of contrast into the IVC and the hepatic veins. Small hiatal hernia.  Musculoskeletal: Multiple osteoporotic compression fractures including T6, T9, T10, T 11, T12, L1 and L2, worst at T12 and L1, with associated kyphous deformity.  Other findings: Mild bilateral gynecomastia.  IMPRESSION: 1. Moderately large right pleural effusion with enhancement of the pleura and increased attenuation of the pleural fluid. 2. Dense airspace consolidation involving the right lower lobe and right middle lobe with "drowned lung" appearance. Therefore, the right pleural effusion  is likely a parapneumonic effusion and the enhancement of the pleura and partial loculation may indicate a developing empyema. 3. Small simple left pleural effusion and associated passive atelectasis versus pneumonia in the left lower lobe. 4. Patchy pneumonia involving the superior segment right lower lobe and the left upper lobe. 5. Cardiomegaly with massive right atrial enlargement and moderate left atrial enlargement. Reflux of contrast into the IVC and hepatic veins is consistent with right heart failure or tricuspid regurgitation.   Electronically Signed  By: Hulan Saas M.D.  On: 08/13/2014 21:05  Impression: 81 year old man with a right pleural effusion. This effusion dates back at least a year. We had worsened recently. He had a thoracentesis in April which did have an elevated LDH. Apparently his fluid has increased in size since he was discharged from the hospital, but I do not have the x-ray available for review.  My guess is that the exacerbation of his effusion was probably parapneumonic/inflammatory in nature. I cannot totally rule out the possibility of it being malignant. However, the cytologies were negative.  Given the chronicity of the process I do not think complete reexpansion of the lower lobe is feasible. However there is significant increase in the size of the effusion we can consider repeat thoracentesis. I'm going to repeat his chest x-ray today to see if there is any significant change compared to his postthoracentesis film in April.  Regardless of whether he needs another thoracentesis at this point in time, I think it's reasonable to give him a steroid taper to see if that helps at all. It may well improve things that there is an inflammatory component.  Plan: Repeat PA and lateral chest x-ray.  If the effusion is enlarged in comparison to his most recent x-ray we'll refer him for a repeat ultrasound-guided thoracentesis.  Prednisone taper  I'll  plan to see him back in 3 weeks with a repeat chest x-ray.  Ultimately a pleural catheter might be beneficial depending on how loculated the fluid is.    Loreli Slot, MD [facility] [phone]

## 2014-09-16 ENCOUNTER — Other Ambulatory Visit: Payer: Self-pay | Admitting: Thoracic Surgery (Cardiothoracic Vascular Surgery)

## 2014-09-16 DIAGNOSIS — J90 Pleural effusion, not elsewhere classified: Secondary | ICD-10-CM

## 2014-09-21 ENCOUNTER — Ambulatory Visit (INDEPENDENT_AMBULATORY_CARE_PROVIDER_SITE_OTHER): Payer: Medicare Other | Admitting: Thoracic Surgery (Cardiothoracic Vascular Surgery)

## 2014-09-21 ENCOUNTER — Encounter: Payer: Self-pay | Admitting: Thoracic Surgery (Cardiothoracic Vascular Surgery)

## 2014-09-21 ENCOUNTER — Ambulatory Visit
Admission: RE | Admit: 2014-09-21 | Discharge: 2014-09-21 | Disposition: A | Discharge status: Home / Self Care | Payer: Medicare Other | Source: Ambulatory Visit | Attending: Thoracic Surgery (Cardiothoracic Vascular Surgery) | Admitting: Thoracic Surgery (Cardiothoracic Vascular Surgery)

## 2014-09-21 VITALS — BP 140/78 | HR 65 | Resp 20 | Ht 69.0 in | Wt 143.0 lb

## 2014-09-21 DIAGNOSIS — J90 Pleural effusion, not elsewhere classified: Secondary | ICD-10-CM

## 2014-09-21 DIAGNOSIS — J948 Other specified pleural conditions: Secondary | ICD-10-CM

## 2014-09-21 NOTE — Progress Notes (Signed)
[address]       [place] [id number]             [phone]       HPI:  Mr. Glennon HamiltonHoller returns today for a scheduled follow up visit  He is an 81 yo man with multiple medical problems including atrial fibrillation, recent TIA, recent pneumonia, congestive heart failure, moderate aortic stenosis, depression, protein calorie malnutrition, unspecified muscle weakness, and early dementia.   He has a right pleural effusion dating back at least as far as April 2015. In April he was hospitalized with shortness of breath after a fall. A chest x-ray showed a worsening right pleural effusion. He had a thoracentesis which showed a mixed picture with a protein of less than 3, glucose of 71 and LDH of 92. There are 274 white blood cells, 71% lymphocytes. Cytology was negative. The fluid was noted to be blood tinged. Of note he was on Coumadin prior to that admission.  He was evaluated and treated for pneumonia. He had symptomatic improvement after the thoracentesis. Post discharge he had a chest x-ray that showed an increase in the size of the effusion.   I saw him on 09/02/2014. His wife stated that he was having a lot of coughing and shortness of breath. He was unable to participate in rehab due to his breathing. A repeat chest x-ray was done. This was poor quality as he was sitting in the wheelchair, but there was still a right pleural effusion present.  I treated him with a sterile taper. His wife said that that made him agitated particularly at night.  Over the past 2 weeks she is noted that he seems much less short of breath, has a less frequent cough, and has been able to work with the therapist in rehabilitation.  He complains of bilateral lower chest pain when he breathes. It is mild. He denies any shortness of breath.  Past Medical History  Diagnosis Date  . Atrial fibrillation   . Osteoporosis   . Benign positional vertigo   . Fatty liver   . Vitamin D deficiency     . Peripheral neuropathy   . Bilateral carotid artery disease   . Gilbert's syndrome     Mild  . Depression   . TIA (transient ischemic attack)      Current Outpatient Prescriptions  Medication Sig Dispense Refill  . acetaminophen (TYLENOL) 325 MG tablet Take 2 tablets (650 mg total) by mouth every 6 (six) hours as needed for moderate pain (headache).    Marland Kitchen. aspirin 325 MG tablet Take 1 tablet (325 mg total) by mouth daily.    . cholecalciferol (VITAMIN D) 1000 UNITS tablet Take 1,000 Units by mouth daily.    Marland Kitchen. co-enzyme Q-10 50 MG capsule Take 100 mg by mouth daily.    Marland Kitchen. donepezil (ARICEPT) 10 MG tablet Take 10 mg by mouth at bedtime.    . dutasteride (AVODART) 0.5 MG capsule Take 0.5 mg by mouth at bedtime.     . feeding supplement, ENSURE ENLIVE, (ENSURE ENLIVE) LIQD Take 237 mLs by mouth 2 (two) times daily between meals. 237 mL 12  . finasteride (PROSCAR) 5 MG tablet Take 5 mg by mouth at bedtime.    . furosemide (LASIX) 40 MG tablet Take 40 mg by mouth every morning.    . memantine (NAMENDA) 10 MG tablet Take 10 mg by mouth 2 (two) times daily.    . sertraline (ZOLOFT) 25  MG tablet Take 25 mg by mouth daily.    . tamsulosin (FLOMAX) 0.4 MG CAPS capsule Take 0.4 mg by mouth at bedtime.     No current facility-administered medications for this visit.    Physical Exam BP 140/78 mmHg  Pulse 65  Resp 20  Ht  (1.753 m)  Wt 143 lb (64.864 kg)  BMI 21.11 kg/m2  SpO2 21% Frail 81 year old man in a wheelchair Alert. Oriented to person. Diminished breath sounds right base   Diagnostic Tests: CHEST 2 VIEW  COMPARISON: PA and lateral chest of Sep 02, 2014  FINDINGS: The left lung is adequately inflated and clear. On the right there is persistent volume loss. There is a moderate-sized pleural effusion. Underlying pneumonia is not excluded. There is shift of the mediastinum toward the right which stable. The cardiac silhouette where visualized is not appear enlarged.  The pulmonary vascularity is not engorged. The bony thorax exhibits no acute abnormality.  IMPRESSION: Chronic right-sided volume loss likely secondary to pleural effusion/ empyema. Underlying pneumonia or other alveolar filling process is not excluded. Given the mediastinal shift to the right, an underlying mass in the right lower hemithorax is felt less likely.  If the patient is improving clinically, an additional follow-up chest x-ray in 4-6 weeks would be useful. If the patient is experiencing progressive or new symptoms, chest CT scanning now is recommended.   Electronically Signed  By: David Swaziland M.D.  On: 09/21/2014 15:02   Impression: Mr. Hands is an 81 year old man with a chronic right pleural effusion dating back to April 2015. This effusion worsened recently in the setting of pneumonia.  He had some confusion and agitation with a prednisone taper, but his wife notes that he is more alert, less short of breath, and more able to participate in rehabilitation in the interval since his last visit. It is unclear how much of this might be a beneficial effect prednisone versus just having additional time.  His chest x-ray today is improved from the film from 3 weeks ago. It is very similar in appearance to his chest x-ray from April 2015.  I discussed our options with the family, both his wife and son were present. My recommendation was that we do not do any further interventions given his clinical improvement. If he were to become more short of breath we can repeat a chest x-ray and/or CT scan, and, if necessary, consider pleural catheter placement.  Plan:  I will be happy to see Mr. Gudgel back any time if I can be of any further assitance with his care.  Loreli Slot, MD [facility] [phone]

## 2015-01-27 ENCOUNTER — Other Ambulatory Visit: Payer: Self-pay | Admitting: Geriatric Medicine

## 2015-01-27 ENCOUNTER — Other Ambulatory Visit (HOSPITAL_COMMUNITY): Payer: Self-pay | Admitting: Geriatric Medicine

## 2015-01-27 ENCOUNTER — Ambulatory Visit
Admission: RE | Admit: 2015-01-27 | Discharge: 2015-01-27 | Disposition: A | Discharge status: Home / Self Care | Payer: Medicare Other | Source: Ambulatory Visit | Attending: Geriatric Medicine | Admitting: Geriatric Medicine

## 2015-01-27 DIAGNOSIS — R1310 Dysphagia, unspecified: Secondary | ICD-10-CM

## 2015-01-27 DIAGNOSIS — R059 Cough, unspecified: Secondary | ICD-10-CM

## 2015-01-27 DIAGNOSIS — R05 Cough: Secondary | ICD-10-CM

## 2015-02-14 ENCOUNTER — Ambulatory Visit (HOSPITAL_COMMUNITY)
Admission: RE | Admit: 2015-02-14 | Discharge: 2015-02-14 | Disposition: A | Discharge status: Home / Self Care | Payer: Medicare Other | Source: Ambulatory Visit | Attending: Geriatric Medicine | Admitting: Geriatric Medicine

## 2015-02-14 DIAGNOSIS — R1310 Dysphagia, unspecified: Secondary | ICD-10-CM | POA: Insufficient documentation

## 2015-02-14 NOTE — Procedures (Signed)
Objective Swallowing Evaluation:  (MBS)  Patient Details  Name: Francis Thomas MRN: [id number] Date of Birth: 11-Sep-1924  Today's Date: 02/14/2015 Time: SLP Start Time (ACUTE ONLY): 1305-SLP Stop Time (ACUTE ONLY): 1330 SLP Time Calculation (min) (ACUTE ONLY): 25 min  Past Medical History:  Past Medical History  Diagnosis Date  . Atrial fibrillation   . Osteoporosis   . Benign positional vertigo   . Fatty liver   . Vitamin D deficiency   . Peripheral neuropathy   . Bilateral carotid artery disease   . Gilbert's syndrome     Mild  . Depression   . TIA (transient ischemic attack)    Past Surgical History:  Past Surgical History  Procedure Laterality Date  . Colonoscopy     HPI:  Other Pertinent Information: 81 yr old accompanied by wife for outpatient MBS. History of dementia, TIA, A-fib, osteoporosis, pna (07/2014). BSE 08/13/14 recommended Dys 3/nectar and upgraded to regular thin next therapy session. Wife reports coughing at meals; denies reflux. Wife reports pt eats at a rapid pace and she has to cue to slow down frequently during meals.  No Data Recorded  Assessment / Plan / Recommendation CHL IP CLINICAL IMPRESSIONS 02/14/2015  Therapy Diagnosis Suspected primary esophageal dysphagia  Clinical Impression Pt exhibits suspected primary esphageal dysphagia. Oral manipulation and transit was within functional limits. Mild delayed swallow initiation to pyriform sinuses across liquid consistencies without laryngeal penetration or aspiration observed during assessment. MBS does not diagnose below the level of the UES, however esophageal scan revealed liquid barium slow to enter gastroesophageal junction with stasis at distal esophagus. Additional time and liquid/puree boluses assited transit to stomach. Pt may be experiencing a reflux cough related to esophageal stasis (?). Recommend regular textures using caution with meats, dry crumbly textures and thin liquids, small sips/bites, no  straws, alternate liquids and solids, remain upright 30-45 minutes after meals and full supervision. SLP provided verbal and written education [id number] and made a sign for pt to place on table during meals to remind pt to take small bites/sips and eat slowly. Wife verbalized understanding of results and recommendations. Would pt benefit from additional assessment of esophageal structure and function?       CHL IP TREATMENT RECOMMENDATION 02/14/2015  Treatment Recommendations No treatment recommended at this time     CHL IP DIET RECOMMENDATION 02/14/2015  SLP Diet Recommendations Age appropriate regular solids;Thin  Liquid Administration via (None)  Medication Administration Whole meds with liquid  Compensations Slow rate;Small sips/bites;Follow solids with liquid  Postural Changes and/or Swallow Maneuvers (None)     CHL IP OTHER RECOMMENDATIONS 02/14/2015  Recommended Consults Consider GI evaluation  Oral Care Recommendations Oral care BID  Other Recommendations (None)     No flowsheet data found.   CHL IP FREQUENCY AND DURATION 08/12/2014  Speech Therapy Frequency (ACUTE ONLY) min 2x/week  Treatment Duration 1 week     Pertinent Vitals/Pain     SLP Swallow Goals No flowsheet data found.  No flowsheet data found.    CHL IP REASON FOR REFERRAL 02/14/2015  Reason for Referral Objectively evaluate swallowing function               CHL IP GO 02/14/2015  Functional Assessment Tool Used (No Data)  Functional Limitations Swallowing  Swallow Current Status (V7846) CI  Swallow Goal Status (N6295) CI  Swallow Discharge Status (M8413) CI  Motor Speech Current Status (K4401) (None)  Motor Speech Goal Status (U2725) (None)  Motor Speech Goal Status (D6644) (  None)  Spoken Language Comprehension Current Status [phone](G9159) (None)  Spoken Language Comprehension Goal Status [phone](G9160) (None)  Spoken Language Comprehension Discharge Status [phone](G9161) (None)  Spoken Language Expression  Current Status [phone](G9162) (None)  Spoken Language Expression Goal Status [phone](G9163) (None)  Spoken Language Expression Discharge Status [phone](G9164) (None)  Attention Current Status (B2841(G9165) (None)  Attention Goal Status (L2440(G9166) (None)  Attention Discharge Status [phone](G9167) (None)  Memory Current Status (Z3664(G9168) (None)  Memory Goal Status (Q0347(G9169) (None)  Memory Discharge Status (Q2595(G9170) (None)  Voice Current Status (G3875(G9171) (None)  Voice Goal Status (I4332(G9172) (None)  Voice Discharge Status (R5188(G9173) (None)  Other Speech-Language Pathology Functional Limitation [phone](G9174) (None)  Other Speech-Language Pathology Functional Limitation Goal Status (Y3016(G9175) (None)  Other Speech-Language Pathology Functional Limitation Discharge Status [phone](G9176) (None)           Royce MacadamiaLitaker, Frannie Shedrick Willis 02/14/2015, 2:12 PM   Breck CoonsLisa Willis Lonell FaceLitaker M.Ed [employer] Pager [phone]

## 2015-02-27 ENCOUNTER — Emergency Department (HOSPITAL_COMMUNITY): Payer: Medicare Other

## 2015-02-27 ENCOUNTER — Inpatient Hospital Stay (HOSPITAL_COMMUNITY): Payer: Medicare Other | Admitting: Anesthesiology

## 2015-02-27 ENCOUNTER — Encounter (HOSPITAL_COMMUNITY): Payer: Self-pay | Admitting: *Deleted

## 2015-02-27 ENCOUNTER — Inpatient Hospital Stay (HOSPITAL_COMMUNITY)
Admission: EM | Admit: 2015-02-27 | Discharge: 2015-03-03 | DRG: 481 | Disposition: A | Discharge status: Skilled Nursing Facility | Payer: Medicare Other | Attending: Family Medicine | Admitting: Family Medicine

## 2015-02-27 ENCOUNTER — Inpatient Hospital Stay (HOSPITAL_COMMUNITY): Payer: Medicare Other

## 2015-02-27 ENCOUNTER — Encounter (HOSPITAL_COMMUNITY)
Admission: EM | Disposition: A | Discharge status: Skilled Nursing Facility | Payer: Self-pay | Source: Home / Self Care | Attending: Internal Medicine

## 2015-02-27 DIAGNOSIS — M79604 Pain in right leg: Secondary | ICD-10-CM | POA: Diagnosis present

## 2015-02-27 DIAGNOSIS — Z882 Allergy status to sulfonamides status: Secondary | ICD-10-CM

## 2015-02-27 DIAGNOSIS — R9431 Abnormal electrocardiogram [ECG] [EKG]: Secondary | ICD-10-CM

## 2015-02-27 DIAGNOSIS — S72141A Displaced intertrochanteric fracture of right femur, initial encounter for closed fracture: Principal | ICD-10-CM | POA: Diagnosis present

## 2015-02-27 DIAGNOSIS — I481 Persistent atrial fibrillation: Secondary | ICD-10-CM | POA: Diagnosis not present

## 2015-02-27 DIAGNOSIS — N183 Chronic kidney disease, stage 3 (moderate): Secondary | ICD-10-CM | POA: Diagnosis present

## 2015-02-27 DIAGNOSIS — I5030 Unspecified diastolic (congestive) heart failure: Secondary | ICD-10-CM | POA: Diagnosis present

## 2015-02-27 DIAGNOSIS — Z8673 Personal history of transient ischemic attack (TIA), and cerebral infarction without residual deficits: Secondary | ICD-10-CM | POA: Diagnosis not present

## 2015-02-27 DIAGNOSIS — Z880 Allergy status to penicillin: Secondary | ICD-10-CM | POA: Diagnosis not present

## 2015-02-27 DIAGNOSIS — T148XXA Other injury of unspecified body region, initial encounter: Secondary | ICD-10-CM

## 2015-02-27 DIAGNOSIS — K760 Fatty (change of) liver, not elsewhere classified: Secondary | ICD-10-CM | POA: Diagnosis present

## 2015-02-27 DIAGNOSIS — Z888 Allergy status to other drugs, medicaments and biological substances status: Secondary | ICD-10-CM | POA: Diagnosis not present

## 2015-02-27 DIAGNOSIS — I482 Chronic atrial fibrillation: Secondary | ICD-10-CM | POA: Diagnosis not present

## 2015-02-27 DIAGNOSIS — M810 Age-related osteoporosis without current pathological fracture: Secondary | ICD-10-CM | POA: Diagnosis present

## 2015-02-27 DIAGNOSIS — Y92009 Unspecified place in unspecified non-institutional (private) residence as the place of occurrence of the external cause: Secondary | ICD-10-CM | POA: Diagnosis not present

## 2015-02-27 DIAGNOSIS — S72141S Displaced intertrochanteric fracture of right femur, sequela: Secondary | ICD-10-CM | POA: Diagnosis not present

## 2015-02-27 DIAGNOSIS — D696 Thrombocytopenia, unspecified: Secondary | ICD-10-CM | POA: Diagnosis not present

## 2015-02-27 DIAGNOSIS — I4891 Unspecified atrial fibrillation: Secondary | ICD-10-CM | POA: Diagnosis present

## 2015-02-27 DIAGNOSIS — D62 Acute posthemorrhagic anemia: Secondary | ICD-10-CM | POA: Diagnosis not present

## 2015-02-27 DIAGNOSIS — S72009S Fracture of unspecified part of neck of unspecified femur, sequela: Secondary | ICD-10-CM | POA: Diagnosis not present

## 2015-02-27 DIAGNOSIS — I959 Hypotension, unspecified: Secondary | ICD-10-CM | POA: Diagnosis present

## 2015-02-27 DIAGNOSIS — Z66 Do not resuscitate: Secondary | ICD-10-CM | POA: Diagnosis present

## 2015-02-27 DIAGNOSIS — I272 Other secondary pulmonary hypertension: Secondary | ICD-10-CM | POA: Diagnosis present

## 2015-02-27 DIAGNOSIS — G629 Polyneuropathy, unspecified: Secondary | ICD-10-CM | POA: Diagnosis present

## 2015-02-27 DIAGNOSIS — N179 Acute kidney failure, unspecified: Secondary | ICD-10-CM | POA: Diagnosis not present

## 2015-02-27 DIAGNOSIS — S7221XA Displaced subtrochanteric fracture of right femur, initial encounter for closed fracture: Secondary | ICD-10-CM | POA: Diagnosis present

## 2015-02-27 DIAGNOSIS — F0390 Unspecified dementia without behavioral disturbance: Secondary | ICD-10-CM

## 2015-02-27 DIAGNOSIS — Z7982 Long term (current) use of aspirin: Secondary | ICD-10-CM | POA: Diagnosis not present

## 2015-02-27 DIAGNOSIS — W1830XA Fall on same level, unspecified, initial encounter: Secondary | ICD-10-CM | POA: Diagnosis present

## 2015-02-27 DIAGNOSIS — S72009A Fracture of unspecified part of neck of unspecified femur, initial encounter for closed fracture: Secondary | ICD-10-CM | POA: Diagnosis present

## 2015-02-27 DIAGNOSIS — Z79899 Other long term (current) drug therapy: Secondary | ICD-10-CM | POA: Diagnosis not present

## 2015-02-27 DIAGNOSIS — S72001A Fracture of unspecified part of neck of right femur, initial encounter for closed fracture: Secondary | ICD-10-CM

## 2015-02-27 DIAGNOSIS — Z419 Encounter for procedure for purposes other than remedying health state, unspecified: Secondary | ICD-10-CM

## 2015-02-27 HISTORY — PX: INTRAMEDULLARY (IM) NAIL INTERTROCHANTERIC: SHX5875

## 2015-02-27 LAB — COMPREHENSIVE METABOLIC PANEL
ALBUMIN: 3.3 g/dL — AB (ref 3.5–5.0)
ALK PHOS: 103 U/L (ref 38–126)
ALT: 16 U/L — ABNORMAL LOW (ref 17–63)
AST: 33 U/L (ref 15–41)
Anion gap: 6 (ref 5–15)
BILIRUBIN TOTAL: 1.6 mg/dL — AB (ref 0.3–1.2)
BUN: 29 mg/dL — AB (ref 6–20)
CO2: 28 mmol/L (ref 22–32)
Calcium: 8.5 mg/dL — ABNORMAL LOW (ref 8.9–10.3)
Chloride: 102 mmol/L (ref 101–111)
Creatinine, Ser: 1.09 mg/dL (ref 0.61–1.24)
GFR calc Af Amer: >60 mL/min (ref >60)
GFR calc non Af Amer: 59 mL/min — ABNORMAL LOW (ref >60)
GLUCOSE: 137 mg/dL — AB (ref 65–99)
POTASSIUM: 4.6 mmol/L (ref 3.5–5.1)
SODIUM: 136 mmol/L (ref 135–145)
Total Protein: 6.4 g/dL — ABNORMAL LOW (ref 6.5–8.1)

## 2015-02-27 LAB — CBC WITH DIFFERENTIAL/PLATELET
Basophils Absolute: 0 10*3/uL (ref 0.0–0.1)
Basophils Relative: 0 %
EOS ABS: 0 10*3/uL (ref 0.0–0.7)
EOS PCT: 0 %
HEMATOCRIT: 34.3 % — AB (ref 39.0–52.0)
HEMOGLOBIN: 11.5 g/dL — AB (ref 13.0–17.0)
LYMPHS ABS: 0.6 10*3/uL — AB (ref 0.7–4.0)
Lymphocytes Relative: 6 %
MCH: 33.4 pg (ref 26.0–34.0)
MCHC: 33.5 g/dL (ref 30.0–36.0)
MCV: 99.7 fL (ref 78.0–100.0)
Monocytes Absolute: 0.6 10*3/uL (ref 0.1–1.0)
Monocytes Relative: 5 %
NEUTROS PCT: 89 %
Neutro Abs: 9.2 10*3/uL — ABNORMAL HIGH (ref 1.7–7.7)
Platelets: 173 10*3/uL (ref 150–400)
RBC: 3.44 MIL/uL — ABNORMAL LOW (ref 4.22–5.81)
RDW: 13.1 % (ref 11.5–15.5)
WBC: 10.5 10*3/uL (ref 4.0–10.5)

## 2015-02-27 LAB — I-STAT CHEM 8, ED
BUN: 39 mg/dL — ABNORMAL HIGH (ref 6–20)
CALCIUM ION: 1.07 mmol/L — AB (ref 1.13–1.30)
CHLORIDE: 100 mmol/L — AB (ref 101–111)
CREATININE: 1 mg/dL (ref 0.61–1.24)
GLUCOSE: 130 mg/dL — AB (ref 65–99)
HCT: 38 % — ABNORMAL LOW (ref 39.0–52.0)
Hemoglobin: 12.9 g/dL — ABNORMAL LOW (ref 13.0–17.0)
POTASSIUM: 4.4 mmol/L (ref 3.5–5.1)
Sodium: 138 mmol/L (ref 135–145)
TCO2: 29 mmol/L (ref 0–100)

## 2015-02-27 LAB — MAGNESIUM: Magnesium: 2.1 mg/dL (ref 1.7–2.4)

## 2015-02-27 LAB — SURGICAL PCR SCREEN
MRSA, PCR: NEGATIVE
Staphylococcus aureus: NEGATIVE

## 2015-02-27 LAB — ABO/RH: ABO/RH(D): A NEG

## 2015-02-27 SURGERY — FIXATION, FRACTURE, INTERTROCHANTERIC, WITH INTRAMEDULLARY ROD
Anesthesia: General | Site: Hip | Laterality: Right

## 2015-02-27 MED ORDER — SODIUM CHLORIDE 0.9 % IV SOLN
INTRAVENOUS | Status: DC
  Administered 2015-02-28: 01:00:00 via INTRAVENOUS

## 2015-02-27 MED ORDER — PROPOFOL 10 MG/ML IV BOLUS
INTRAVENOUS | Status: AC
  Filled 2015-02-27: qty 20

## 2015-02-27 MED ORDER — OXYCODONE HCL 5 MG PO TABS: 5.0000 mg | ORAL_TABLET | ORAL | Status: DC | PRN

## 2015-02-27 MED ORDER — HYDROCODONE-ACETAMINOPHEN 5-325 MG PO TABS
1.0000 | ORAL_TABLET | Freq: Four times a day (QID) | ORAL | Status: DC | PRN
  Administered 2015-02-28: 2 via ORAL
  Administered 2015-03-02 – 2015-03-03 (×2): 1 via ORAL
  Filled 2015-02-27: qty 1
  Filled 2015-02-27: qty 2
  Filled 2015-02-27: qty 1

## 2015-02-27 MED ORDER — ASPIRIN EC 325 MG PO TBEC: 325.0000 mg | DELAYED_RELEASE_TABLET | Freq: Two times a day (BID) | ORAL | Status: DC

## 2015-02-27 MED ORDER — ENOXAPARIN SODIUM 40 MG/0.4ML ~~LOC~~ SOLN
40.0000 mg | SUBCUTANEOUS | Status: DC
  Filled 2015-02-27: qty 0.4

## 2015-02-27 MED ORDER — EPHEDRINE SULFATE 50 MG/ML IJ SOLN
INTRAMUSCULAR | Status: DC | PRN
  Administered 2015-02-27: 5 mg via INTRAVENOUS

## 2015-02-27 MED ORDER — SENNOSIDES-DOCUSATE SODIUM 8.6-50 MG PO TABS: 1.0000 | ORAL_TABLET | Freq: Every evening | ORAL | Status: DC | PRN

## 2015-02-27 MED ORDER — SUGAMMADEX SODIUM 200 MG/2ML IV SOLN
INTRAVENOUS | Status: DC | PRN
  Administered 2015-02-27: 200 mg via INTRAVENOUS

## 2015-02-27 MED ORDER — METOCLOPRAMIDE HCL 5 MG PO TABS: 5.0000 mg | ORAL_TABLET | Freq: Three times a day (TID) | ORAL | Status: DC | PRN

## 2015-02-27 MED ORDER — ONDANSETRON HCL 4 MG/2ML IJ SOLN: 4.0000 mg | Freq: Four times a day (QID) | INTRAMUSCULAR | Status: DC | PRN

## 2015-02-27 MED ORDER — METOCLOPRAMIDE HCL 5 MG/ML IJ SOLN: 5.0000 mg | Freq: Three times a day (TID) | INTRAMUSCULAR | Status: DC | PRN

## 2015-02-27 MED ORDER — MORPHINE SULFATE (PF) 2 MG/ML IV SOLN: 0.5000 mg | INTRAVENOUS | Status: DC | PRN

## 2015-02-27 MED ORDER — PHENYLEPHRINE HCL 10 MG/ML IJ SOLN
INTRAMUSCULAR | Status: DC | PRN
  Administered 2015-02-27 (×3): 80 ug via INTRAVENOUS

## 2015-02-27 MED ORDER — MORPHINE SULFATE (PF) 2 MG/ML IV SOLN
0.5000 mg | INTRAVENOUS | Status: DC | PRN
  Administered 2015-02-28: 0.5 mg via INTRAVENOUS
  Filled 2015-02-27: qty 1

## 2015-02-27 MED ORDER — PHENYLEPHRINE 40 MCG/ML (10ML) SYRINGE FOR IV PUSH (FOR BLOOD PRESSURE SUPPORT)
PREFILLED_SYRINGE | INTRAVENOUS | Status: AC
  Filled 2015-02-27: qty 10

## 2015-02-27 MED ORDER — ASPIRIN EC 325 MG PO TBEC
325.0000 mg | DELAYED_RELEASE_TABLET | Freq: Two times a day (BID) | ORAL | Status: DC
Start: 2015-02-28 — End: 2015-03-01
  Administered 2015-02-28 (×2): 325 mg via ORAL
  Filled 2015-02-27 (×2): qty 1

## 2015-02-27 MED ORDER — HYDROCODONE-ACETAMINOPHEN 5-325 MG PO TABS: 1.0000 | ORAL_TABLET | Freq: Four times a day (QID) | ORAL | Status: DC | PRN

## 2015-02-27 MED ORDER — METHOCARBAMOL 1000 MG/10ML IJ SOLN
500.0000 mg | Freq: Four times a day (QID) | INTRAVENOUS | Status: DC | PRN
  Filled 2015-02-27 (×2): qty 5

## 2015-02-27 MED ORDER — ONDANSETRON HCL 4 MG PO TABS: 4.0000 mg | ORAL_TABLET | Freq: Four times a day (QID) | ORAL | Status: DC | PRN

## 2015-02-27 MED ORDER — LACTATED RINGERS IV SOLN
INTRAVENOUS | Status: DC | PRN
  Administered 2015-02-27 (×2): via INTRAVENOUS

## 2015-02-27 MED ORDER — ALUM & MAG HYDROXIDE-SIMETH 200-200-20 MG/5ML PO SUSP: 30.0000 mL | ORAL | Status: DC | PRN

## 2015-02-27 MED ORDER — FENTANYL CITRATE (PF) 250 MCG/5ML IJ SOLN
INTRAMUSCULAR | Status: DC | PRN
  Administered 2015-02-27 (×2): 50 ug via INTRAVENOUS

## 2015-02-27 MED ORDER — CLINDAMYCIN PHOSPHATE 600 MG/50ML IV SOLN
INTRAVENOUS | Status: AC
  Administered 2015-02-27: 600 mg via INTRAVENOUS
  Filled 2015-02-27: qty 50

## 2015-02-27 MED ORDER — EPHEDRINE SULFATE 50 MG/ML IJ SOLN
INTRAMUSCULAR | Status: AC
  Filled 2015-02-27: qty 1

## 2015-02-27 MED ORDER — CLINDAMYCIN PHOSPHATE 600 MG/50ML IV SOLN
600.0000 mg | Freq: Four times a day (QID) | INTRAVENOUS | Status: AC
  Administered 2015-02-28 (×2): 600 mg via INTRAVENOUS
  Filled 2015-02-27 (×2): qty 50

## 2015-02-27 MED ORDER — ACETAMINOPHEN 325 MG PO TABS: 650.0000 mg | ORAL_TABLET | Freq: Four times a day (QID) | ORAL | Status: DC | PRN

## 2015-02-27 MED ORDER — ALBUMIN HUMAN 5 % IV SOLN
INTRAVENOUS | Status: DC | PRN
  Administered 2015-02-27 (×3): via INTRAVENOUS

## 2015-02-27 MED ORDER — ACETAMINOPHEN 650 MG RE SUPP: 650.0000 mg | Freq: Four times a day (QID) | RECTAL | Status: DC | PRN

## 2015-02-27 MED ORDER — MENTHOL 3 MG MT LOZG: 1.0000 | LOZENGE | OROMUCOSAL | Status: DC | PRN

## 2015-02-27 MED ORDER — OXYCODONE-ACETAMINOPHEN 5-325 MG PO TABS: 1.0000 | ORAL_TABLET | ORAL | Status: DC | PRN

## 2015-02-27 MED ORDER — ROCURONIUM BROMIDE 100 MG/10ML IV SOLN
INTRAVENOUS | Status: DC | PRN
  Administered 2015-02-27: 40 mg via INTRAVENOUS

## 2015-02-27 MED ORDER — ONDANSETRON HCL 4 MG/2ML IJ SOLN
INTRAMUSCULAR | Status: AC
  Filled 2015-02-27: qty 2

## 2015-02-27 MED ORDER — METHOCARBAMOL 500 MG PO TABS: 500.0000 mg | ORAL_TABLET | Freq: Four times a day (QID) | ORAL | Status: DC | PRN

## 2015-02-27 MED ORDER — FUROSEMIDE 40 MG PO TABS
40.0000 mg | ORAL_TABLET | Freq: Every day | ORAL | Status: DC
  Administered 2015-02-28: 40 mg via ORAL
  Filled 2015-02-27 (×2): qty 1

## 2015-02-27 MED ORDER — PHENOL 1.4 % MT LIQD: 1.0000 | OROMUCOSAL | Status: DC | PRN

## 2015-02-27 MED ORDER — PHENYLEPHRINE HCL 10 MG/ML IJ SOLN
10.0000 mg | INTRAVENOUS | Status: DC | PRN
  Administered 2015-02-27: 60 ug/min via INTRAVENOUS

## 2015-02-27 MED ORDER — METHOCARBAMOL 1000 MG/10ML IJ SOLN
500.0000 mg | Freq: Four times a day (QID) | INTRAVENOUS | Status: DC | PRN
  Administered 2015-02-28: 500 mg via INTRAVENOUS
  Filled 2015-02-27 (×2): qty 5

## 2015-02-27 MED ORDER — ONDANSETRON HCL 4 MG/2ML IJ SOLN
INTRAMUSCULAR | Status: DC | PRN
  Administered 2015-02-27: 4 mg via INTRAVENOUS

## 2015-02-27 MED ORDER — SERTRALINE HCL 25 MG PO TABS
25.0000 mg | ORAL_TABLET | Freq: Every day | ORAL | Status: DC
  Administered 2015-02-28 – 2015-03-03 (×4): 25 mg via ORAL
  Filled 2015-02-27 (×5): qty 1

## 2015-02-27 MED ORDER — TAMSULOSIN HCL 0.4 MG PO CAPS
0.4000 mg | ORAL_CAPSULE | Freq: Every day | ORAL | Status: DC
  Administered 2015-02-28 – 2015-03-02 (×3): 0.4 mg via ORAL
  Filled 2015-02-27 (×4): qty 1

## 2015-02-27 MED ORDER — SODIUM CHLORIDE 0.9 % IJ SOLN
INTRAMUSCULAR | Status: AC
  Filled 2015-02-27: qty 10

## 2015-02-27 MED ORDER — SUGAMMADEX SODIUM 200 MG/2ML IV SOLN
INTRAVENOUS | Status: AC
  Filled 2015-02-27: qty 2

## 2015-02-27 MED ORDER — 0.9 % SODIUM CHLORIDE (POUR BTL) OPTIME
TOPICAL | Status: DC | PRN
  Administered 2015-02-27 (×2): 1000 mL

## 2015-02-27 MED ORDER — FENTANYL CITRATE (PF) 250 MCG/5ML IJ SOLN
INTRAMUSCULAR | Status: AC
  Filled 2015-02-27: qty 5

## 2015-02-27 MED ORDER — PROPOFOL 10 MG/ML IV BOLUS
INTRAVENOUS | Status: DC | PRN
  Administered 2015-02-27: 70 mg via INTRAVENOUS

## 2015-02-27 MED ORDER — MEMANTINE HCL 10 MG PO TABS
10.0000 mg | ORAL_TABLET | Freq: Two times a day (BID) | ORAL | Status: DC
  Administered 2015-02-28 – 2015-03-03 (×7): 10 mg via ORAL
  Filled 2015-02-27 (×3): qty 1
  Filled 2015-02-27: qty 2
  Filled 2015-02-27 (×6): qty 1

## 2015-02-27 MED ORDER — DUTASTERIDE 0.5 MG PO CAPS
0.5000 mg | ORAL_CAPSULE | Freq: Every day | ORAL | Status: DC
  Administered 2015-02-28 – 2015-03-02 (×3): 0.5 mg via ORAL
  Filled 2015-02-27 (×5): qty 1

## 2015-02-27 MED ORDER — DONEPEZIL HCL 10 MG PO TABS
10.0000 mg | ORAL_TABLET | Freq: Every day | ORAL | Status: DC
  Administered 2015-02-28 – 2015-03-02 (×3): 10 mg via ORAL
  Filled 2015-02-27 (×4): qty 1

## 2015-02-27 MED ORDER — LIDOCAINE HCL (CARDIAC) 20 MG/ML IV SOLN
INTRAVENOUS | Status: DC | PRN
  Administered 2015-02-27: 100 mg via INTRAVENOUS

## 2015-02-27 SURGICAL SUPPLY — 50 items
BIT DRILL SHORT 4.0 (BIT) IMPLANT
BLADE SURG 15 STRL LF DISP TIS (BLADE) ×1 IMPLANT
BLADE SURG 15 STRL SS (BLADE)
BNDG COHESIVE 4X5 TAN NS LF (GAUZE/BANDAGES/DRESSINGS) ×1 IMPLANT
BNDG COHESIVE 6X5 TAN STRL LF (GAUZE/BANDAGES/DRESSINGS) IMPLANT
BNDG GAUZE ELAST 4 BULKY (GAUZE/BANDAGES/DRESSINGS) ×1 IMPLANT
CANISTER SUCTION WELLS/JOHNSON (MISCELLANEOUS) ×2 IMPLANT
COVER PERINEAL POST (MISCELLANEOUS) ×3 IMPLANT
COVER SURGICAL LIGHT HANDLE (MISCELLANEOUS) ×3 IMPLANT
DRAPE C-ARMOR (DRAPES) ×2 IMPLANT
DRAPE INCISE IOBAN 66X45 STRL (DRAPES) ×2 IMPLANT
DRAPE PROXIMA HALF (DRAPES) IMPLANT
DRAPE STERI IOBAN 125X83 (DRAPES) ×3 IMPLANT
DRILL BIT SHORT 4.0 (BIT) ×3
DRSG MEPILEX BORDER 4X4 (GAUZE/BANDAGES/DRESSINGS) ×7 IMPLANT
DRSG MEPILEX BORDER 4X8 (GAUZE/BANDAGES/DRESSINGS) ×1 IMPLANT
DRSG PAD ABDOMINAL 8X10 ST (GAUZE/BANDAGES/DRESSINGS) ×2 IMPLANT
DURAPREP 26ML APPLICATOR (WOUND CARE) ×5 IMPLANT
ELECT CAUTERY BLADE 6.4 (BLADE) ×3 IMPLANT
ELECT REM PT RETURN 9FT ADLT (ELECTROSURGICAL) ×3
ELECTRODE REM PT RTRN 9FT ADLT (ELECTROSURGICAL) ×1 IMPLANT
FACESHIELD WRAPAROUND (MASK) IMPLANT
FACESHIELD WRAPAROUND OR TEAM (MASK) ×1 IMPLANT
GAUZE XEROFORM 5X9 LF (GAUZE/BANDAGES/DRESSINGS) ×1 IMPLANT
GLOVE NEODERM STRL 7.5 LF PF (GLOVE) ×2 IMPLANT
GLOVE SURG NEODERM 7.5  LF PF (GLOVE) ×4
GOWN STRL REIN XL XLG (GOWN DISPOSABLE) ×3 IMPLANT
GUIDE PIN 3.2X343 (PIN) ×1
GUIDE PIN 3.2X343MM (PIN) ×3
KIT BASIN OR (CUSTOM PROCEDURE TRAY) ×3 IMPLANT
KIT ROOM TURNOVER OR (KITS) ×3 IMPLANT
LINER BOOT UNIVERSAL DISP (MISCELLANEOUS) ×1 IMPLANT
MANIFOLD NEPTUNE II (INSTRUMENTS) ×1 IMPLANT
NAIL TRIGEN INTERT 11.5X40-125 (Nail) ×2 IMPLANT
NS IRRIG 1000ML POUR BTL (IV SOLUTION) ×7 IMPLANT
PACK GENERAL/GYN (CUSTOM PROCEDURE TRAY) ×3 IMPLANT
PAD ARMBOARD 7.5X6 YLW CONV (MISCELLANEOUS) ×6 IMPLANT
PAD CAST 4YDX4 CTTN HI CHSV (CAST SUPPLIES) ×2 IMPLANT
PADDING CAST COTTON 4X4 STRL (CAST SUPPLIES)
PIN GUIDE 3.2X343MM (PIN) IMPLANT
SCREW LAG COMPR KIT 100/95 (Screw) ×4 IMPLANT
STAPLER VISISTAT 35W (STAPLE) ×3 IMPLANT
SUT VIC AB 0 CT1 27 (SUTURE) ×3
SUT VIC AB 0 CT1 27XBRD ANBCTR (SUTURE) ×2 IMPLANT
SUT VIC AB 1 CT1 27 (SUTURE) ×3
SUT VIC AB 1 CT1 27XBRD ANBCTR (SUTURE) IMPLANT
SUT VIC AB 2-0 CT1 27 (SUTURE) ×3
SUT VIC AB 2-0 CT1 TAPERPNT 27 (SUTURE) ×2 IMPLANT
TOWEL OR 17X24 6PK STRL BLUE (TOWEL DISPOSABLE) ×3 IMPLANT
TOWEL OR 17X26 10 PK STRL BLUE (TOWEL DISPOSABLE) ×3 IMPLANT

## 2015-02-27 NOTE — Progress Notes (Signed)
Pt admitted to room 13 for fx femur. Hx dementia, pt oriented to family. Bed alarm on and pt in camera observation room. Wife informed and agrees. Pt to OR.

## 2015-02-27 NOTE — Anesthesia Preprocedure Evaluation (Addendum)
Anesthesia Evaluation  Patient identified by MRN, date of birth, ID band Patient awake    Reviewed: Allergy & Precautions, NPO status , Patient's Chart, lab work & pertinent test results  History of Anesthesia Complications Negative for: history of anesthetic complications  Airway Mallampati: II  TM Distance: >3 FB Neck ROM: Full    Dental  (+) Teeth Intact, Dental Advisory Given   Pulmonary neg pulmonary ROS,    Pulmonary exam normal        Cardiovascular + Peripheral Vascular Disease  + dysrhythmias Atrial Fibrillation + Valvular Problems/Murmurs AS  Rhythm:Irregular Rate:Normal  Study Conclusions  - Left ventricle: The cavity size was normal. Wall thickness was increased in a pattern of moderate LVH. Systolic function was normal. The estimated ejection fraction was in the range of 55% to 60%. Wall motion was normal; there were no regional wall motion abnormalities. - Aortic valve: Cusp separation was reduced. There was mild to moderate stenosis. Valve area (VTI): 0.87 cm^2. Valve area (Vmax): 0.75 cm^2. Valve area (Vmean): 0.85 cm^2.    Neuro/Psych PSYCHIATRIC DISORDERS Depression Dementia TIA   GI/Hepatic negative GI ROS, Neg liver ROS,   Endo/Other  negative endocrine ROS  Renal/GU negative Renal ROS     Musculoskeletal   Abdominal   Peds  Hematology   Anesthesia Other Findings   Reproductive/Obstetrics                           Anesthesia Physical Anesthesia Plan  ASA: III  Anesthesia Plan: General   Post-op Pain Management:    Induction: Intravenous  Airway Management Planned: LMA  Additional Equipment:   Intra-op Plan:   Post-operative Plan: Extubation in OR  Informed Consent: I have reviewed the patients History and Physical, chart, labs and discussed the procedure including the risks, benefits and alternatives for the proposed anesthesia with the  patient or authorized representative who has indicated his/her understanding and acceptance.   Dental advisory given and Consent reviewed with POA  Plan Discussed with: CRNA, Anesthesiologist and Surgeon  Anesthesia Plan Comments:        Anesthesia Quick Evaluation

## 2015-02-27 NOTE — ED Notes (Signed)
Bed: WA02 Expected date:  Expected time:  Means of arrival:  Comments: Dislocated hip ?

## 2015-02-27 NOTE — Progress Notes (Signed)
Comments:  Dr Krista BlueSinger w/ anesthesiology service has recommended SDU overnight. Order placed.   Leanne ChangKatherine P. Schorr, NP-C [facility] Pager [phone]

## 2015-02-27 NOTE — H&P (Signed)
PCP:   Ginette Otto, MD   Chief Complaint:  Fall  HPI: 81 year old male who   has a past medical history of Atrial fibrillation (HCC); Osteoporosis; Benign positional vertigo; Fatty liver; Vitamin D deficiency; Peripheral neuropathy (HCC); Bilateral carotid artery disease (HCC); Gilbert's syndrome; Depression; and TIA (transient ischemic attack). Today was brought to the hospital after patient had a fall at home. As per wife patient was sitting in his bedroom when she heard a loud thud and found him on the floor but patient was conscious. He was complaining of right hip pain. Patient has dementia so no significant history could be obtained. There was no loss of consciousness. No chest pain or shortness of breath. No nausea vomiting or diarrhea. In the ED x-ray of the hip was done which showed comminuted right intertrochanteric and subtrochanteric fracture. Orthopedic surgery Dr Roda Shutters  consulted by the ED physician.   Allergies:   Allergies  Allergen Reactions  . Penicillin G Benzathine     Has patient had a PCN reaction causing immediate rash, facial/tongue/throat swelling, SOB or lightheadedness with hypotension: unknown Has patient had a PCN reaction causing severe rash involving mucus membranes or skin necrosis: unknwon Has patient had a PCN reaction that required hospitalization unknown Has patient had a PCN reaction occurring within the last 10 years: no If all of the above answers are "NO", then may proceed with Cephalosporin use.   Marland Kitchen Scopolamine Base [Scopolamine]     unknown  . Skelaxin [Metaxalone]     Confusion  . Sulfa Antibiotics     unknown      Past Medical History  Diagnosis Date  . Atrial fibrillation (HCC)   . Osteoporosis   . Benign positional vertigo   . Fatty liver   . Vitamin D deficiency   . Peripheral neuropathy (HCC)   . Bilateral carotid artery disease (HCC)   . Gilbert's syndrome     Mild  . Depression   . TIA (transient ischemic  attack)     Past Surgical History  Procedure Laterality Date  . Colonoscopy      Prior to Admission medications   Medication Sig Start Date End Date Taking? Authorizing Provider  acetaminophen (TYLENOL) 325 MG tablet Take 2 tablets (650 mg total) by mouth every 6 (six) hours as needed for moderate pain (headache). 08/16/14  Yes Christiane Ha, MD  aspirin 325 MG tablet Take 1 tablet (325 mg total) by mouth daily. 08/16/14  Yes Christiane Ha, MD  donepezil (ARICEPT) 10 MG tablet Take 10 mg by mouth at bedtime.   Yes Historical Provider, MD  dutasteride (AVODART) 0.5 MG capsule Take 0.5 mg by mouth at bedtime.    Yes Historical Provider, MD  furosemide (LASIX) 40 MG tablet Take 40 mg by mouth every morning.   Yes Historical Provider, MD  Melatonin 5 MG TABS Take 5 mg by mouth at bedtime.   Yes Historical Provider, MD  memantine (NAMENDA) 10 MG tablet Take 10 mg by mouth 2 (two) times daily.   Yes Historical Provider, MD  sertraline (ZOLOFT) 25 MG tablet Take 25 mg by mouth daily.   Yes Historical Provider, MD  tamsulosin (FLOMAX) 0.4 MG CAPS capsule Take 0.4 mg by mouth at bedtime.   Yes Historical Provider, MD  feeding supplement, ENSURE ENLIVE, (ENSURE ENLIVE) LIQD Take 237 mLs by mouth 2 (two) times daily between meals. Patient not taking: Reported on 02/27/2015 08/16/14   Christiane Ha, MD    Social  History:  reports that he has never smoked. He has never used smokeless tobacco. He reports that he drinks alcohol. He reports that he does not use illicit drugs.    Review of Systems:  As in  the history of present illness, rest of the review systems is unobtainable   Physical Exam: Blood pressure 112/57, pulse 79, temperature 97.4 F (36.3 C), temperature source Oral, resp. rate 21, SpO2 97 %. Constitutional:   Patient is a well-developed and well-nourished male in no acute distress and cooperative with exam. Head: Normocephalic and atraumatic Mouth: Mucus membranes  moist Eyes: PERRL, EOMI, conjunctivae normal Neck: Supple, No Thyromegaly Cardiovascular: RRR, S1 normal, S2 normal Pulmonary/Chest: CTAB, no wheezes, rales, or rhonchi Abdominal: Soft. Non-tender, non-distended, bowel sounds are normal, no masses, organomegaly, or guarding present.  Neurological: Alert and oriented 2, Strength is normal and symmetric bilaterally, cranial nerve II-XII are grossly intact, no focal motor deficit, sensory intact to light touch bilaterally.  Extremities : Tenderness to palpation at right hip  Labs on Admission:  Basic Metabolic Panel:  Recent Labs Lab 02/27/15 1218 02/27/15 1237  NA 136 138  K 4.6 4.4  CL 102 100*  CO2 28  --   GLUCOSE 137* 130*  BUN 29* 39*  CREATININE 1.09 1.00  CALCIUM 8.5*  --    Liver Function Tests:  Recent Labs Lab 02/27/15 1218  AST 33  ALT 16*  ALKPHOS 103  BILITOT 1.6*  PROT 6.4*  ALBUMIN 3.3*   CBC:  Recent Labs Lab 02/27/15 1218 02/27/15 1237  WBC 10.5  --   NEUTROABS 9.2*  --   HGB 11.5* 12.9*  HCT 34.3* 38.0*  MCV 99.7  --   PLT 173  --    Cardiac Enzymes: No results for input(s): CKTOTAL, CKMB, CKMBINDEX, TROPONINI in the last 168 hours.  BNP (last 3 results)  Recent Labs  08/11/14 1035  BNP 320.9*     Radiological Exams on Admission: Ct Head Wo Contrast  02/27/2015  CLINICAL DATA:  Post fall EXAM: CT HEAD WITHOUT CONTRAST CT CERVICAL SPINE WITHOUT CONTRAST TECHNIQUE: Multidetector CT imaging of the head and cervical spine was performed following the standard protocol without intravenous contrast. Multiplanar CT image reconstructions of the cervical spine were also generated. COMPARISON:  Head CT - 09/19/2010 ; brain MRI - 05/18/2010 FINDINGS: CT HEAD FINDINGS Regional soft tissues appear normal. No radiopaque foreign body. No displaced calvarial fracture. Similar findings of advanced atrophy with sulcal prominence and centralized volume loss with commensurate ex vacuo dilatation of the  ventricular system, asymmetrically involving the occipital horn of left lateral ventricle, unchanged. Scattered periventricular hypodensities compatible microvascular ischemic disease. Given extensive background parenchymal abnormalities, there is no CT evidence of superimposed acute large territory infarct. No intraparenchymal or extra-axial mass or hemorrhage. Unchanged configuration of ventricles and basilar cisterns. No midline shift. Intracranial atherosclerosis. Limited visualization the paranasal sinuses and mastoid air cells is normal. No air-fluid levels. Post bilateral cataract surgery. CT CERVICAL SPINE FINDINGS C1 to the superior endplate of T3 is imaged. Accentuated cervical lordosis without anterolisthesis or retrolisthesis. The bilateral facets are normally aligned. The dens is normally positioned between the lateral masses of C1. Moderate degenerative change of the atlantodental articulation. Normal atlantoaxial articulations. No fracture or static subluxation of the cervical spine. Cervical vertebral body heights are preserved. Prevertebral soft tissues are normal. Mild to moderate multilevel cervical spine DDD, worse at C5-C6 and to a lesser extent, C3-C4 and C6-C7 with disc space height loss, endplate  irregularity and sclerosis. Atherosclerotic plaque within the bilateral carotid bulbs. No bulky cervical lymphadenopathy on this noncontrast examination. Limited visualization of the lung apices is normal. IMPRESSION: 1. Similar findings of advanced atrophy and microvascular ischemic disease without acute intracranial process. 2. No fracture or static subluxation of the cervical spine. 3. Accentuated cervical lordosis without anterolisthesis. 4. Mild-to-moderate multilevel cervical spine DDD, worse at C5-C6. 5. Atherosclerotic plaque within the bilateral carotid bulbs. Further evaluation with nonemergent carotid Doppler ultrasound could be performed as clinically indicated. Electronically Signed    By: Simonne Come M.D.   On: 02/27/2015 13:43   Ct Cervical Spine Wo Contrast  02/27/2015  CLINICAL DATA:  Post fall EXAM: CT HEAD WITHOUT CONTRAST CT CERVICAL SPINE WITHOUT CONTRAST TECHNIQUE: Multidetector CT imaging of the head and cervical spine was performed following the standard protocol without intravenous contrast. Multiplanar CT image reconstructions of the cervical spine were also generated. COMPARISON:  Head CT - 09/19/2010 ; brain MRI - 05/18/2010 FINDINGS: CT HEAD FINDINGS Regional soft tissues appear normal. No radiopaque foreign body. No displaced calvarial fracture. Similar findings of advanced atrophy with sulcal prominence and centralized volume loss with commensurate ex vacuo dilatation of the ventricular system, asymmetrically involving the occipital horn of left lateral ventricle, unchanged. Scattered periventricular hypodensities compatible microvascular ischemic disease. Given extensive background parenchymal abnormalities, there is no CT evidence of superimposed acute large territory infarct. No intraparenchymal or extra-axial mass or hemorrhage. Unchanged configuration of ventricles and basilar cisterns. No midline shift. Intracranial atherosclerosis. Limited visualization the paranasal sinuses and mastoid air cells is normal. No air-fluid levels. Post bilateral cataract surgery. CT CERVICAL SPINE FINDINGS C1 to the superior endplate of T3 is imaged. Accentuated cervical lordosis without anterolisthesis or retrolisthesis. The bilateral facets are normally aligned. The dens is normally positioned between the lateral masses of C1. Moderate degenerative change of the atlantodental articulation. Normal atlantoaxial articulations. No fracture or static subluxation of the cervical spine. Cervical vertebral body heights are preserved. Prevertebral soft tissues are normal. Mild to moderate multilevel cervical spine DDD, worse at C5-C6 and to a lesser extent, C3-C4 and C6-C7 with disc space height  loss, endplate irregularity and sclerosis. Atherosclerotic plaque within the bilateral carotid bulbs. No bulky cervical lymphadenopathy on this noncontrast examination. Limited visualization of the lung apices is normal. IMPRESSION: 1. Similar findings of advanced atrophy and microvascular ischemic disease without acute intracranial process. 2. No fracture or static subluxation of the cervical spine. 3. Accentuated cervical lordosis without anterolisthesis. 4. Mild-to-moderate multilevel cervical spine DDD, worse at C5-C6. 5. Atherosclerotic plaque within the bilateral carotid bulbs. Further evaluation with nonemergent carotid Doppler ultrasound could be performed as clinically indicated. Electronically Signed   By: Simonne Come M.D.   On: 02/27/2015 13:43   [name]  02/27/2015  CLINICAL DATA:  Larey Seat today. EXAM: PORTABLE CHEST 1 VIEW COMPARISON:  Multiple prior chest x-rays and a chest CT from 08/13/2014 FINDINGS: Examination is limited by severe rotation of the patient. The heart is grossly normal in size and there is tortuosity, ectasia and calcification of the thoracic aorta. Chronic severe right-sided pleural thickening and/or pleural effusion with overlying atelectasis and/or scarring. The left lung is clear. Six, seventh and eighth right rib fractures are noted but these appear to be chronic based on the prior CT scan. IMPRESSION: Chronic right pleural effusion, pleural thickening and overlying atelectasis or scarring. The left lung is clear. Remote healed right rib fractures. Electronically Signed   By: Orlene Plum.D.  On: 02/27/2015 13:25   Dg Hip Unilat With Pelvis 2-3 Views Right  02/27/2015  CLINICAL DATA:  Right hip pain after falling on the floor at home today. EXAM: DG HIP (WITH OR WITHOUT PELVIS) 2-3V RIGHT COMPARISON:  None. FINDINGS: Comminuted right intertrochanteric and subtrochanteric fracture with varus angulation. Diffuse osteopenia. IMPRESSION: Comminuted right  intertrochanteric and subtrochanteric fracture. Electronically Signed   By: Beckie Salts M.D.   On: 02/27/2015 12:29   Dg Femur Port, Min 2 Views Right  02/27/2015  CLINICAL DATA:  Unwitnessed fall at home today.  Right hip pain. EXAM: RIGHT FEMUR PORTABLE 1 VIEW COMPARISON:  None. FINDINGS: There is a severely comminuted intertrochanteric fracture of the proximal right femur with angulation and some over riding. There is severe osteopenia. Distal femur is intact. IMPRESSION: Comminuted angulated over riding intertrochanteric fracture of the proximal right femur. Electronically Signed   By: Francene Boyers M.D.   On: 02/27/2015 13:45    EKG: Independently reviewed. Atrial fibrillation, QTc prolongation 570   Assessment/Plan Active Problems:   Dementia   Atrial fibrillation (HCC)   Hip fracture (HCC)   Abnormal QT interval present on electrocardiogram  Hip fracture x-ray of the hip was done which showed comminuted right intertrochanteric and subtrochanteric fracture. Orthopedic surgery consulted by the ED physician. Patient will transferred to [facility] for surgery. Will initiate hip fracture protocol  Atrial fibrillation Heart rate controlled, patient is currently on aspirin for A. Fib CHADS2vasc score is 3, patient was on Coumadin in the past which was taken off as per wife due to pleural effusion Will hold aspirin for surgery.  Dementia Patient is on Aricept and Namenda. Continue above medications  Chronic right pleural effusion Patient has chronic right pleural effusion, no change on the x-ray  Bilateral carotid artery disease Patient has bilateral carotid artery disease, atherosclerotic plaques seen within the bilateral carotid bulbs on CT scan of cervical spine. Patient currently on aspirin.   Prolonged QTC Patient has prolonged QTc at 570, will check serum magnesium level Monitor patient on telemetry.  DVT prophylaxis Lovenox  Risk stratification Patient is  a moderate  risk for surgery due to the advanced age.   Code status: DO NOT RESUSCITATE  Family discussion: Admission, patients condition and plan of care including tests being ordered have been discussed with the patient and his wife at bedside*who indicate understanding and agree with the plan and Code Status.   Time Spent on Admission: 60 min   LAMA,GAGAN S [facility] Pager: [phone] 02/27/2015, 2:19 PM  If 7PM-7AM, please contact night-coverage  [email or web address]  Password [email or web address]

## 2015-02-27 NOTE — Anesthesia Postprocedure Evaluation (Signed)
Anesthesia Post Note  Patient: Francis Thomas  Procedure(s) Performed: Procedure(s) (LRB): INTRAMEDULLARY (IM) NAIL INTERTROCHANTRIC (Right)  Anesthesia type: general  Patient location: PACU  Post pain: Pain level controlled  Post assessment: Patient's Cardiovascular Status Stable  Last Vitals:  Filed Vitals:   02/27/15 2050  BP: 100/66  Pulse: 86  Temp: 36.4 C  Resp: 21    Post vital signs: Reviewed and stable  Level of consciousness: sedated  Complications: No apparent anesthesia complications

## 2015-02-27 NOTE — ED Notes (Signed)
Per EMS - patient comes from home where he lives with his wife.  Patient's wife states she heard a "clunk," and found patient on floor in other room.  Patient has right leg shortening with external rotation.  Patient c/o right hip pain, but his affect is somewhat diminished due to dementia.

## 2015-02-27 NOTE — Discharge Instructions (Signed)
    1. Change dressings as needed 2. May shower but keep incisions covered and dry 3. Take aspirin to prevent blood clots 4. Take stool softeners as needed 5. Take pain meds as needed  

## 2015-02-27 NOTE — Transfer of Care (Signed)
Immediate Anesthesia Transfer of Care Note  Patient: Francis Thomas Record  Procedure(s) Performed: Procedure(s): INTRAMEDULLARY (IM) NAIL INTERTROCHANTRIC (Right)  Patient Location: PACU  Anesthesia Type:General  Level of Consciousness: awake  Airway & Oxygen Therapy: Patient Spontanous Breathing and Patient connected to face mask oxygen  Post-op Assessment: Report given to RN and Post -op Vital signs reviewed and stable  Post vital signs: Reviewed and stable  Last Vitals:  Filed Vitals:   02/27/15 1539  BP: 135/77  Pulse: 75  Temp: 36.4 C  Resp: 20    Complications: No apparent anesthesia complications

## 2015-02-27 NOTE — ED Provider Notes (Signed)
CSN: [id number]     Arrival date & time 02/27/15  1056 History   First MD Initiated Contact with Patient 02/27/15 1151     Chief Complaint  Patient presents with  . Fall     (Consider location/radiation/quality/duration/timing/severity/associated sxs/prior Treatment) Patient is a 81 y.o. male presenting with fall. The history is provided by the spouse (The patient's wife stated that she heard a loud thud coming from her husband's bedroom. She found him on the floor but he was conscious. He was complaining of right hip pain).  Fall This is a new problem. The current episode started 6 to 12 hours ago. The problem occurs constantly. The problem has not changed since onset.Pertinent negatives include no chest pain and no abdominal pain. Exacerbated by: Movement of hip. Nothing relieves the symptoms.    Past Medical History  Diagnosis Date  . Atrial fibrillation (HCC)   . Osteoporosis   . Benign positional vertigo   . Fatty liver   . Vitamin D deficiency   . Peripheral neuropathy (HCC)   . Bilateral carotid artery disease (HCC)   . Gilbert's syndrome     Mild  . Depression   . TIA (transient ischemic attack)    Past Surgical History  Procedure Laterality Date  . Colonoscopy     No family history on file. Social History  Substance Use Topics  . Smoking status: Never Smoker   . Smokeless tobacco: Never Used  . Alcohol Use: Yes     Comment: Occasional Glass of wine or beer    Review of Systems  Unable to perform ROS: Dementia  Cardiovascular: Negative for chest pain.  Gastrointestinal: Negative for abdominal pain.      Allergies  Penicillin g benzathine; Scopolamine base; Skelaxin; and Sulfa antibiotics  Home Medications   Prior to Admission medications   Medication Sig Start Date End Date Taking? Authorizing Provider  acetaminophen (TYLENOL) 325 MG tablet Take 2 tablets (650 mg total) by mouth every 6 (six) hours as needed for moderate pain (headache). 08/16/14  Yes  Christiane Haorinna L Sullivan, MD  aspirin 325 MG tablet Take 1 tablet (325 mg total) by mouth daily. 08/16/14  Yes Christiane Haorinna L Sullivan, MD  donepezil (ARICEPT) 10 MG tablet Take 10 mg by mouth at bedtime.   Yes Historical Provider, MD  dutasteride (AVODART) 0.5 MG capsule Take 0.5 mg by mouth at bedtime.    Yes Historical Provider, MD  furosemide (LASIX) 40 MG tablet Take 40 mg by mouth every morning.   Yes Historical Provider, MD  Melatonin 5 MG TABS Take 5 mg by mouth at bedtime.   Yes Historical Provider, MD  memantine (NAMENDA) 10 MG tablet Take 10 mg by mouth 2 (two) times daily.   Yes Historical Provider, MD  sertraline (ZOLOFT) 25 MG tablet Take 25 mg by mouth daily.   Yes Historical Provider, MD  tamsulosin (FLOMAX) 0.4 MG CAPS capsule Take 0.4 mg by mouth at bedtime.   Yes Historical Provider, MD  feeding supplement, ENSURE ENLIVE, (ENSURE ENLIVE) LIQD Take 237 mLs by mouth 2 (two) times daily between meals. Patient not taking: Reported on 02/27/2015 08/16/14   Christiane Haorinna L Sullivan, MD   BP 133/57 mmHg  Pulse 67  Temp(Src) 97.4 F (36.3 C) (Oral)  Resp 20  SpO2 100% Physical Exam  Constitutional: He appears well-developed.  HENT:  Head: Normocephalic.  Eyes: Conjunctivae and EOM are normal. No scleral icterus.  Neck: Neck supple. No thyromegaly present.  Cardiovascular: Normal rate and regular  rhythm.  Exam reveals no gallop and no friction rub.   No murmur heard. Pulmonary/Chest: No stridor. He has no wheezes. He has no rales. He exhibits no tenderness.  Abdominal: He exhibits no distension. There is no tenderness. There is no rebound.  Musculoskeletal: He exhibits no edema.  Tenderness to right hip with palpation. And pain with rotation of leg right time  Lymphadenopathy:    He has no cervical adenopathy.  Neurological: He is alert. He exhibits normal muscle tone. Coordination normal.  Patient is oriented to person only  Skin: No rash noted. No erythema.  Psychiatric: He has a normal  mood and affect. His behavior is normal.    ED Course  Procedures (including critical care time) Labs Review Labs Reviewed  CBC WITH DIFFERENTIAL/PLATELET - Abnormal; Notable for the following:    RBC 3.44 (*)    Hemoglobin 11.5 (*)    HCT 34.3 (*)    Neutro Abs 9.2 (*)    Lymphs Abs 0.6 (*)    All other components within normal limits  COMPREHENSIVE METABOLIC PANEL - Abnormal; Notable for the following:    Glucose, Bld 137 (*)    BUN 29 (*)    Calcium 8.5 (*)    Total Protein 6.4 (*)    Albumin 3.3 (*)    ALT 16 (*)    Total Bilirubin 1.6 (*)    GFR calc non Af Amer 59 (*)    All other components within normal limits  I-STAT CHEM 8, ED - Abnormal; Notable for the following:    Chloride 100 (*)    BUN 39 (*)    Glucose, Bld 130 (*)    Calcium, Ion 1.07 (*)    Hemoglobin 12.9 (*)    HCT 38.0 (*)    All other components within normal limits  TYPE AND SCREEN    Imaging Review Ct Head Wo Contrast  02/27/2015  CLINICAL DATA:  Post fall EXAM: CT HEAD WITHOUT CONTRAST CT CERVICAL SPINE WITHOUT CONTRAST TECHNIQUE: Multidetector CT imaging of the head and cervical spine was performed following the standard protocol without intravenous contrast. Multiplanar CT image reconstructions of the cervical spine were also generated. COMPARISON:  Head CT - 09/19/2010 ; brain MRI - 05/18/2010 FINDINGS: CT HEAD FINDINGS Regional soft tissues appear normal. No radiopaque foreign body. No displaced calvarial fracture. Similar findings of advanced atrophy with sulcal prominence and centralized volume loss with commensurate ex vacuo dilatation of the ventricular system, asymmetrically involving the occipital horn of left lateral ventricle, unchanged. Scattered periventricular hypodensities compatible microvascular ischemic disease. Given extensive background parenchymal abnormalities, there is no CT evidence of superimposed acute large territory infarct. No intraparenchymal or extra-axial mass or  hemorrhage. Unchanged configuration of ventricles and basilar cisterns. No midline shift. Intracranial atherosclerosis. Limited visualization the paranasal sinuses and mastoid air cells is normal. No air-fluid levels. Post bilateral cataract surgery. CT CERVICAL SPINE FINDINGS C1 to the superior endplate of T3 is imaged. Accentuated cervical lordosis without anterolisthesis or retrolisthesis. The bilateral facets are normally aligned. The dens is normally positioned between the lateral masses of C1. Moderate degenerative change of the atlantodental articulation. Normal atlantoaxial articulations. No fracture or static subluxation of the cervical spine. Cervical vertebral body heights are preserved. Prevertebral soft tissues are normal. Mild to moderate multilevel cervical spine DDD, worse at C5-C6 and to a lesser extent, C3-C4 and C6-C7 with disc space height loss, endplate irregularity and sclerosis. Atherosclerotic plaque within the bilateral carotid bulbs. No bulky cervical lymphadenopathy on  this noncontrast examination. Limited visualization of the lung apices is normal. IMPRESSION: 1. Similar findings of advanced atrophy and microvascular ischemic disease without acute intracranial process. 2. No fracture or static subluxation of the cervical spine. 3. Accentuated cervical lordosis without anterolisthesis. 4. Mild-to-moderate multilevel cervical spine DDD, worse at C5-C6. 5. Atherosclerotic plaque within the bilateral carotid bulbs. Further evaluation with nonemergent carotid Doppler ultrasound could be performed as clinically indicated. Electronically Signed   By: Simonne Come M.D.   On: 02/27/2015 13:43   Ct Cervical Spine Wo Contrast  02/27/2015  CLINICAL DATA:  Post fall EXAM: CT HEAD WITHOUT CONTRAST CT CERVICAL SPINE WITHOUT CONTRAST TECHNIQUE: Multidetector CT imaging of the head and cervical spine was performed following the standard protocol without intravenous contrast. Multiplanar CT image  reconstructions of the cervical spine were also generated. COMPARISON:  Head CT - 09/19/2010 ; brain MRI - 05/18/2010 FINDINGS: CT HEAD FINDINGS Regional soft tissues appear normal. No radiopaque foreign body. No displaced calvarial fracture. Similar findings of advanced atrophy with sulcal prominence and centralized volume loss with commensurate ex vacuo dilatation of the ventricular system, asymmetrically involving the occipital horn of left lateral ventricle, unchanged. Scattered periventricular hypodensities compatible microvascular ischemic disease. Given extensive background parenchymal abnormalities, there is no CT evidence of superimposed acute large territory infarct. No intraparenchymal or extra-axial mass or hemorrhage. Unchanged configuration of ventricles and basilar cisterns. No midline shift. Intracranial atherosclerosis. Limited visualization the paranasal sinuses and mastoid air cells is normal. No air-fluid levels. Post bilateral cataract surgery. CT CERVICAL SPINE FINDINGS C1 to the superior endplate of T3 is imaged. Accentuated cervical lordosis without anterolisthesis or retrolisthesis. The bilateral facets are normally aligned. The dens is normally positioned between the lateral masses of C1. Moderate degenerative change of the atlantodental articulation. Normal atlantoaxial articulations. No fracture or static subluxation of the cervical spine. Cervical vertebral body heights are preserved. Prevertebral soft tissues are normal. Mild to moderate multilevel cervical spine DDD, worse at C5-C6 and to a lesser extent, C3-C4 and C6-C7 with disc space height loss, endplate irregularity and sclerosis. Atherosclerotic plaque within the bilateral carotid bulbs. No bulky cervical lymphadenopathy on this noncontrast examination. Limited visualization of the lung apices is normal. IMPRESSION: 1. Similar findings of advanced atrophy and microvascular ischemic disease without acute intracranial process. 2.  No fracture or static subluxation of the cervical spine. 3. Accentuated cervical lordosis without anterolisthesis. 4. Mild-to-moderate multilevel cervical spine DDD, worse at C5-C6. 5. Atherosclerotic plaque within the bilateral carotid bulbs. Further evaluation with nonemergent carotid Doppler ultrasound could be performed as clinically indicated. Electronically Signed   By: Simonne Come M.D.   On: 02/27/2015 13:43   [name]  02/27/2015  CLINICAL DATA:  Larey Seat today. EXAM: PORTABLE CHEST 1 VIEW COMPARISON:  Multiple prior chest x-rays and a chest CT from 08/13/2014 FINDINGS: Examination is limited by severe rotation of the patient. The heart is grossly normal in size and there is tortuosity, ectasia and calcification of the thoracic aorta. Chronic severe right-sided pleural thickening and/or pleural effusion with overlying atelectasis and/or scarring. The left lung is clear. Six, seventh and eighth right rib fractures are noted but these appear to be chronic based on the prior CT scan. IMPRESSION: Chronic right pleural effusion, pleural thickening and overlying atelectasis or scarring. The left lung is clear. Remote healed right rib fractures. Electronically Signed   By: Rudie Meyer M.D.   On: 02/27/2015 13:25   Dg Hip Unilat With Pelvis 2-3 Views Right  02/27/2015  CLINICAL DATA:  Right hip pain after falling on the floor at home today. EXAM: DG HIP (WITH OR WITHOUT PELVIS) 2-3V RIGHT COMPARISON:  None. FINDINGS: Comminuted right intertrochanteric and subtrochanteric fracture with varus angulation. Diffuse osteopenia. IMPRESSION: Comminuted right intertrochanteric and subtrochanteric fracture. Electronically Signed   By: Beckie Salts M.D.   On: 02/27/2015 12:29   Dg Femur Port, Min 2 Views Right  02/27/2015  CLINICAL DATA:  Unwitnessed fall at home today.  Right hip pain. EXAM: RIGHT FEMUR PORTABLE 1 VIEW COMPARISON:  None. FINDINGS: There is a severely comminuted intertrochanteric fracture of  the proximal right femur with angulation and some over riding. There is severe osteopenia. Distal femur is intact. IMPRESSION: Comminuted angulated over riding intertrochanteric fracture of the proximal right femur. Electronically Signed   By: Francene Boyers M.D.   On: 02/27/2015 13:45   I have personally reviewed and evaluated these images and lab results as part of my medical decision-making.   EKG Interpretation None      MDM   Final diagnoses:  Hip fracture, right, closed, initial encounter (HCC)    Fractured hip. I spoke with Dr. Roda Shutters and he will fix this patient's hip tomorrow. [facility] hospitalist will be admitting to [facility]    Bethann Berkshire, MD 02/27/15 1358

## 2015-02-27 NOTE — Op Note (Signed)
   Date of Surgery: 02/27/2015  INDICATIONS: Mr. Francis Thomas is a 81 y.o.-year-old male who sustained a right hip fracture. The risks and benefits of the procedure discussed with the patient and family prior to the procedure and all questions were answered; consent was obtained.  PREOPERATIVE DIAGNOSIS: right hip fracture   POSTOPERATIVE DIAGNOSIS: Same   PROCEDURE: Treatment of pertrochanteric fracture with intramedullary implant. CPT [phone]   SURGEON: N. Glee ArvinMichael Thomas, M.D.   ANESTHESIA: general   IV FLUIDS AND URINE: See anesthesia record   ESTIMATED BLOOD LOSS: 300 cc  IMPLANTS: [employer] InterTAN 11.5 x 40, 100/95  DRAINS: None.   COMPLICATIONS: None.   DESCRIPTION OF PROCEDURE: The patient was brought to the operating room and placed supine on the operating table. The patient's leg had been signed prior to the procedure. The patient had the anesthesia placed by the anesthesiologist. The prep verification and incision time-outs were performed to confirm that this was the correct patient, site, side and location. The patient had an SCD on the opposite lower extremity. The patient did receive antibiotics prior to the incision and was re-dosed during the procedure as needed at indicated intervals. The patient was positioned on the fracture table with the table in traction and internal rotation to reduce the hip. The well leg was placed in a scissor position and all bony prominences were well-padded. The patient had the lower extremity prepped and draped in the standard surgical fashion. The incision was made 4 finger breadths superior to the greater trochanter. A guide pin was inserted into the tip of the greater trochanter under fluoroscopic guidance. An opening reamer was used to gain access to the femoral canal. The nail length was measured and inserted down the femoral canal to its proper depth. The appropriate version of insertion for the lag screw was found under fluoroscopy. A pin was  inserted up the femoral neck through the jig. Then, a second antirotation pin was inserted inferior to the first pin. The length of the lag screw was then measured. The lag screw was inserted as near to center-center in the head as possible. The antirotation pin was then taken out and an interdigitating compression screw was placed in its place. The leg was taken out of traction, then the interdigitating compression screw was used to compress across the fracture. Compression was visualized on serial xrays. One distal interlocking screw was placed using the perfect circle technique.  The wound was copiously irrigated with saline and the subcutaneous layer closed with 2.0 vicryl and the skin was reapproximated with staples. The wounds were cleaned and dried a final time and a sterile dressing was placed. The hip was taken through a range of motion at the end of the case under fluoroscopic imaging to visualize the approach-withdraw phenomenon and confirm implant length in the head. The patient was then awakened from anesthesia and taken to the recovery room in stable condition. All counts were correct at the end of the case.   POSTOPERATIVE PLAN: The patient will be touchdown weight bearing and will return in 2 weeks for staple removal and the patient will receive DVT prophylaxis based on other medications, activity level, and risk ratio of bleeding to thrombosis.   Francis ReelN. Michael Xu, MD [facility] [phone] 6:53 PM

## 2015-02-27 NOTE — ED Notes (Addendum)
Patient presents from home where he lives with his wife via EMS.  Patient is alert to person and place, but not time or events. with right leg external rotation and shortening.  +2 dorsalis pedis pulses palpated both lower extremities.  +2 radial pulses bilaterally.  Patient has light touch sensation RLE.  Patient denies neck and back pain.  Patient's lung sounds are clear bilaterally.  Rate regular, no rub or gallop.

## 2015-02-27 NOTE — Anesthesia Procedure Notes (Signed)
Procedure Name: Intubation Date/Time: 02/27/2015 5:13 PM Performed by: Alanda AmassFRIEDMAN, Leanora Murin A Pre-anesthesia Checklist: Patient identified, Timeout performed, Emergency Drugs available, Suction available and Patient being monitored Patient Re-evaluated:Patient Re-evaluated prior to inductionOxygen Delivery Method: Circle system utilized Preoxygenation: Pre-oxygenation with 100% oxygen Intubation Type: IV induction Ventilation: Mask ventilation without difficulty Laryngoscope Size: Mac and 3 Grade View: Grade III Tube type: Oral Tube size: 7.5 mm Number of attempts: 1 Airway Equipment and Method: Stylet Placement Confirmation: ETT inserted through vocal cords under direct vision,  breath sounds checked- equal and bilateral and positive ETCO2 Secured at: 21 cm Tube secured with: Tape Dental Injury: Teeth and Oropharynx as per pre-operative assessment

## 2015-02-27 NOTE — Consult Note (Signed)
ORTHOPAEDIC CONSULTATION  REQUESTING PHYSICIAN: Renae Fickle, MD  Chief Complaint: right hip fracture  HPI: Francis Thomas is a 81 y.o. male who presents with right hip fracture s/p mechanical fall.  The patient endorses severe pain in the right hip, that does not radiate, grinding in quality, worse with any movement, better with immobilization.  Denies LOC/fever/chills/nausea/vomiting.  Walks with assistive devices (walker, cane, wheelchair).  Does live independently with wife.    Past Medical History  Diagnosis Date  . Atrial fibrillation (HCC)   . Osteoporosis   . Benign positional vertigo   . Fatty liver   . Vitamin D deficiency   . Peripheral neuropathy (HCC)   . Bilateral carotid artery disease (HCC)   . Gilbert's syndrome     Mild  . Depression   . TIA (transient ischemic attack)    Past Surgical History  Procedure Laterality Date  . Colonoscopy     Social History   Social History  . Marital Status: Married    Spouse Name: N/A  . Number of Children: N/A  . Years of Education: N/A   Social History Main Topics  . Smoking status: Never Smoker   . Smokeless tobacco: Never Used  . Alcohol Use: Yes     Comment: Occasional Glass of wine or beer  . Drug Use: No  . Sexual Activity: Not Asked   Other Topics Concern  . None   Social History Narrative   No family history on file. Allergies  Allergen Reactions  . Penicillin G Benzathine     Has patient had a PCN reaction causing immediate rash, facial/tongue/throat swelling, SOB or lightheadedness with hypotension: unknown Has patient had a PCN reaction causing severe rash involving mucus membranes or skin necrosis: unknwon Has patient had a PCN reaction that required hospitalization unknown Has patient had a PCN reaction occurring within the last 10 years: no If all of the above answers are "NO", then may proceed with Cephalosporin use.   Marland Kitchen Scopolamine Base [Scopolamine]     unknown  . Skelaxin [Metaxalone]      Confusion  . Sulfa Antibiotics     unknown   Prior to Admission medications   Medication Sig Start Date End Date Taking? Authorizing Provider  acetaminophen (TYLENOL) 325 MG tablet Take 2 tablets (650 mg total) by mouth every 6 (six) hours as needed for moderate pain (headache). 08/16/14  Yes Christiane Ha, MD  aspirin 325 MG tablet Take 1 tablet (325 mg total) by mouth daily. 08/16/14  Yes Christiane Ha, MD  donepezil (ARICEPT) 10 MG tablet Take 10 mg by mouth at bedtime.   Yes Historical Provider, MD  dutasteride (AVODART) 0.5 MG capsule Take 0.5 mg by mouth at bedtime.    Yes Historical Provider, MD  furosemide (LASIX) 40 MG tablet Take 40 mg by mouth every morning.   Yes Historical Provider, MD  Melatonin 5 MG TABS Take 5 mg by mouth at bedtime.   Yes Historical Provider, MD  memantine (NAMENDA) 10 MG tablet Take 10 mg by mouth 2 (two) times daily.   Yes Historical Provider, MD  sertraline (ZOLOFT) 25 MG tablet Take 25 mg by mouth daily.   Yes Historical Provider, MD  tamsulosin (FLOMAX) 0.4 MG CAPS capsule Take 0.4 mg by mouth at bedtime.   Yes Historical Provider, MD  feeding supplement, ENSURE ENLIVE, (ENSURE ENLIVE) LIQD Take 237 mLs by mouth 2 (two) times daily between meals. Patient not taking: Reported on 02/27/2015 08/16/14  Christiane Ha, MD   Ct Head Wo Contrast  02/27/2015  CLINICAL DATA:  Post fall EXAM: CT HEAD WITHOUT CONTRAST CT CERVICAL SPINE WITHOUT CONTRAST TECHNIQUE: Multidetector CT imaging of the head and cervical spine was performed following the standard protocol without intravenous contrast. Multiplanar CT image reconstructions of the cervical spine were also generated. COMPARISON:  Head CT - 09/19/2010 ; brain MRI - 05/18/2010 FINDINGS: CT HEAD FINDINGS Regional soft tissues appear normal. No radiopaque foreign body. No displaced calvarial fracture. Similar findings of advanced atrophy with sulcal prominence and centralized volume loss with  commensurate ex vacuo dilatation of the ventricular system, asymmetrically involving the occipital horn of left lateral ventricle, unchanged. Scattered periventricular hypodensities compatible microvascular ischemic disease. Given extensive background parenchymal abnormalities, there is no CT evidence of superimposed acute large territory infarct. No intraparenchymal or extra-axial mass or hemorrhage. Unchanged configuration of ventricles and basilar cisterns. No midline shift. Intracranial atherosclerosis. Limited visualization the paranasal sinuses and mastoid air cells is normal. No air-fluid levels. Post bilateral cataract surgery. CT CERVICAL SPINE FINDINGS C1 to the superior endplate of T3 is imaged. Accentuated cervical lordosis without anterolisthesis or retrolisthesis. The bilateral facets are normally aligned. The dens is normally positioned between the lateral masses of C1. Moderate degenerative change of the atlantodental articulation. Normal atlantoaxial articulations. No fracture or static subluxation of the cervical spine. Cervical vertebral body heights are preserved. Prevertebral soft tissues are normal. Mild to moderate multilevel cervical spine DDD, worse at C5-C6 and to a lesser extent, C3-C4 and C6-C7 with disc space height loss, endplate irregularity and sclerosis. Atherosclerotic plaque within the bilateral carotid bulbs. No bulky cervical lymphadenopathy on this noncontrast examination. Limited visualization of the lung apices is normal. IMPRESSION: 1. Similar findings of advanced atrophy and microvascular ischemic disease without acute intracranial process. 2. No fracture or static subluxation of the cervical spine. 3. Accentuated cervical lordosis without anterolisthesis. 4. Mild-to-moderate multilevel cervical spine DDD, worse at C5-C6. 5. Atherosclerotic plaque within the bilateral carotid bulbs. Further evaluation with nonemergent carotid Doppler ultrasound could be performed as  clinically indicated. Electronically Signed   By: Simonne Come M.D.   On: 02/27/2015 13:43   Ct Cervical Spine Wo Contrast  02/27/2015  CLINICAL DATA:  Post fall EXAM: CT HEAD WITHOUT CONTRAST CT CERVICAL SPINE WITHOUT CONTRAST TECHNIQUE: Multidetector CT imaging of the head and cervical spine was performed following the standard protocol without intravenous contrast. Multiplanar CT image reconstructions of the cervical spine were also generated. COMPARISON:  Head CT - 09/19/2010 ; brain MRI - 05/18/2010 FINDINGS: CT HEAD FINDINGS Regional soft tissues appear normal. No radiopaque foreign body. No displaced calvarial fracture. Similar findings of advanced atrophy with sulcal prominence and centralized volume loss with commensurate ex vacuo dilatation of the ventricular system, asymmetrically involving the occipital horn of left lateral ventricle, unchanged. Scattered periventricular hypodensities compatible microvascular ischemic disease. Given extensive background parenchymal abnormalities, there is no CT evidence of superimposed acute large territory infarct. No intraparenchymal or extra-axial mass or hemorrhage. Unchanged configuration of ventricles and basilar cisterns. No midline shift. Intracranial atherosclerosis. Limited visualization the paranasal sinuses and mastoid air cells is normal. No air-fluid levels. Post bilateral cataract surgery. CT CERVICAL SPINE FINDINGS C1 to the superior endplate of T3 is imaged. Accentuated cervical lordosis without anterolisthesis or retrolisthesis. The bilateral facets are normally aligned. The dens is normally positioned between the lateral masses of C1. Moderate degenerative change of the atlantodental articulation. Normal atlantoaxial articulations. No fracture or static subluxation of the cervical  spine. Cervical vertebral body heights are preserved. Prevertebral soft tissues are normal. Mild to moderate multilevel cervical spine DDD, worse at C5-C6 and to a lesser  extent, C3-C4 and C6-C7 with disc space height loss, endplate irregularity and sclerosis. Atherosclerotic plaque within the bilateral carotid bulbs. No bulky cervical lymphadenopathy on this noncontrast examination. Limited visualization of the lung apices is normal. IMPRESSION: 1. Similar findings of advanced atrophy and microvascular ischemic disease without acute intracranial process. 2. No fracture or static subluxation of the cervical spine. 3. Accentuated cervical lordosis without anterolisthesis. 4. Mild-to-moderate multilevel cervical spine DDD, worse at C5-C6. 5. Atherosclerotic plaque within the bilateral carotid bulbs. Further evaluation with nonemergent carotid Doppler ultrasound could be performed as clinically indicated. Electronically Signed   By: Simonne ComeJohn  Watts M.D.   On: 02/27/2015 13:43   [name]  02/27/2015  CLINICAL DATA:  Larey SeatFell today. EXAM: PORTABLE CHEST 1 VIEW COMPARISON:  Multiple prior chest x-rays and a chest CT from 08/13/2014 FINDINGS: Examination is limited by severe rotation of the patient. The heart is grossly normal in size and there is tortuosity, ectasia and calcification of the thoracic aorta. Chronic severe right-sided pleural thickening and/or pleural effusion with overlying atelectasis and/or scarring. The left lung is clear. Six, seventh and eighth right rib fractures are noted but these appear to be chronic based on the prior CT scan. IMPRESSION: Chronic right pleural effusion, pleural thickening and overlying atelectasis or scarring. The left lung is clear. Remote healed right rib fractures. Electronically Signed   By: Rudie MeyerP.  Gallerani M.D.   On: 02/27/2015 13:25   Dg Hip Unilat With Pelvis 2-3 Views Right  02/27/2015  CLINICAL DATA:  Right hip pain after falling on the floor at home today. EXAM: DG HIP (WITH OR WITHOUT PELVIS) 2-3V RIGHT COMPARISON:  None. FINDINGS: Comminuted right intertrochanteric and subtrochanteric fracture with varus angulation. Diffuse  osteopenia. IMPRESSION: Comminuted right intertrochanteric and subtrochanteric fracture. Electronically Signed   By: Beckie SaltsSteven  Reid M.D.   On: 02/27/2015 12:29   Dg Femur Port, Min 2 Views Right  02/27/2015  CLINICAL DATA:  Unwitnessed fall at home today.  Right hip pain. EXAM: RIGHT FEMUR PORTABLE 1 VIEW COMPARISON:  None. FINDINGS: There is a severely comminuted intertrochanteric fracture of the proximal right femur with angulation and some over riding. There is severe osteopenia. Distal femur is intact. IMPRESSION: Comminuted angulated over riding intertrochanteric fracture of the proximal right femur. Electronically Signed   By: [name]  Maxwell M.D.   On: 02/27/2015 13:45    Positive ROS: All other systems have been reviewed and were otherwise negative with the exception of those mentioned in the HPI and as above.  Physical Exam: General: No acute distress Cardiovascular: No pedal edema Respiratory: No cyanosis, no use of accessory musculature GI: No organomegaly, abdomen is soft and non-tender Skin: No lesions in the area of chief complaint Neurologic: Sensation intact distally Psychiatric: Patient is demented Lymphatic: No axillary or cervical lymphadenopathy  MUSCULOSKELETAL:  - severe pain with movement of the hip and extremity - skin intact - NVI distally - compartments soft  Assessment: right hip fracture  Plan: - surgery is recommended, patient and family are aware of r/b/a and wish to proceed - consent obtained - medical optimization per primary team - surgery is planned for today  Thank you for the consult and the opportunity to see Mr. Norment  N. Glee ArvinMichael Xu, MD [facility] [phone] 4:45 PM

## 2015-02-28 ENCOUNTER — Encounter (HOSPITAL_COMMUNITY): Payer: Self-pay | Admitting: Orthopaedic Surgery

## 2015-02-28 DIAGNOSIS — S72141A Displaced intertrochanteric fracture of right femur, initial encounter for closed fracture: Secondary | ICD-10-CM

## 2015-02-28 DIAGNOSIS — I481 Persistent atrial fibrillation: Secondary | ICD-10-CM

## 2015-02-28 LAB — BASIC METABOLIC PANEL
Anion gap: 9 (ref 5–15)
BUN: 34 mg/dL — AB (ref 6–20)
CALCIUM: 8.6 mg/dL — AB (ref 8.9–10.3)
CHLORIDE: 102 mmol/L (ref 101–111)
CO2: 25 mmol/L (ref 22–32)
CREATININE: 1.54 mg/dL — AB (ref 0.61–1.24)
GFR calc non Af Amer: 39 mL/min — ABNORMAL LOW (ref >60)
GFR, EST AFRICAN AMERICAN: 45 mL/min — AB (ref >60)
GLUCOSE: 174 mg/dL — AB (ref 65–99)
Potassium: 4.7 mmol/L (ref 3.5–5.1)
Sodium: 136 mmol/L (ref 135–145)

## 2015-02-28 LAB — CBC
HEMATOCRIT: 22.6 % — AB (ref 39.0–52.0)
HEMATOCRIT: 22.3 % — AB (ref 39.0–52.0)
HEMOGLOBIN: 7.9 g/dL — AB (ref 13.0–17.0)
Hemoglobin: 7.5 g/dL — ABNORMAL LOW (ref 13.0–17.0)
MCH: 34.6 pg — AB (ref 26.0–34.0)
MCH: 33.5 pg (ref 26.0–34.0)
MCHC: 35 g/dL (ref 30.0–36.0)
MCHC: 33.6 g/dL (ref 30.0–36.0)
MCV: 99.1 fL (ref 78.0–100.0)
MCV: 99.6 fL (ref 78.0–100.0)
PLATELETS: 109 10*3/uL — AB (ref 150–400)
Platelets: 117 10*3/uL — ABNORMAL LOW (ref 150–400)
RBC: 2.28 MIL/uL — ABNORMAL LOW (ref 4.22–5.81)
RBC: 2.24 MIL/uL — ABNORMAL LOW (ref 4.22–5.81)
RDW: 13.1 % (ref 11.5–15.5)
RDW: 13.4 % (ref 11.5–15.5)
WBC: 9.4 10*3/uL (ref 4.0–10.5)
WBC: 8.5 10*3/uL (ref 4.0–10.5)

## 2015-02-28 LAB — MAGNESIUM: Magnesium: 2.1 mg/dL (ref 1.7–2.4)

## 2015-02-28 LAB — POCT I-STAT 7, (LYTES, BLD GAS, ICA,H+H)
ACID-BASE DEFICIT: 1 mmol/L (ref 0.0–2.0)
Bicarbonate: 25.1 mEq/L — ABNORMAL HIGH (ref 20.0–24.0)
CALCIUM ION: 1.11 mmol/L — AB (ref 1.13–1.30)
HCT: 29 % — ABNORMAL LOW (ref 39.0–52.0)
HEMOGLOBIN: 9.9 g/dL — AB (ref 13.0–17.0)
O2 Saturation: 100 %
PH ART: 7.359 (ref 7.350–7.450)
PO2 ART: 291 mmHg — AB (ref 80.0–100.0)
Potassium: 4.2 mmol/L (ref 3.5–5.1)
SODIUM: 136 mmol/L (ref 135–145)
TCO2: 26 mmol/L (ref 0–100)
pCO2 arterial: 44.5 mmHg (ref 35.0–45.0)

## 2015-02-28 LAB — HEMOGLOBIN AND HEMATOCRIT, BLOOD
HEMATOCRIT: 25.7 % — AB (ref 39.0–52.0)
HEMOGLOBIN: 8.6 g/dL — AB (ref 13.0–17.0)

## 2015-02-28 LAB — ALBUMIN: Albumin: 2.9 g/dL — ABNORMAL LOW (ref 3.5–5.0)

## 2015-02-28 LAB — PREPARE RBC (CROSSMATCH)

## 2015-02-28 MED ORDER — SODIUM CHLORIDE 0.9 % IV SOLN
Freq: Once | INTRAVENOUS | Status: AC
  Administered 2015-02-28: 12:00:00 via INTRAVENOUS

## 2015-02-28 MED ORDER — SODIUM CHLORIDE 0.9 % IV SOLN
1.0000 g | Freq: Once | INTRAVENOUS | Status: AC
  Administered 2015-02-28: 1 g via INTRAVENOUS
  Filled 2015-02-28: qty 10

## 2015-02-28 MED ORDER — ENOXAPARIN SODIUM 30 MG/0.3ML ~~LOC~~ SOLN
30.0000 mg | SUBCUTANEOUS | Status: DC
  Administered 2015-02-28 – 2015-03-02 (×3): 30 mg via SUBCUTANEOUS
  Filled 2015-02-28 (×4): qty 0.3

## 2015-02-28 NOTE — Progress Notes (Addendum)
[facility] Progress Note   Francis CRISTOBAL  Thomas:[id number]  DOB: 1924/10/13  DOA: 02/27/2015 PCP: Ginette Otto, MD  Brief narrative: Francis Thomas is a 81 y.o. male with PMH of A-fib on aspirin, Gibert's Syndrome, TIAs and dementia who presents after a fall at home and is found to have a right hip fracture. He underwent and intramedullary implant placement on 11/6.   Subjective: No complaints of pain, nausea, vomiting, dyspnea or cough.   Assessment/Plan: Principal Problem:   Intertrochanteric fracture of right hip  - s/p repair- management per orthopedic service - Pt eval  Active Problems:   Dementia - stable- no behavioral disturbanced noted thus far    Atrial fibrillation  - on ASA 325 mg daily for anticoagulation- currently on ASA 325 BID for DVT prophylaxis after hip repair  Anemia - acute on chronic due to acute blood loss- drop from 11.5 to 7.5 today (has been rechecked to ensure accuracy) - transfuse 1 U PRBC today and follow- no other source of blood loss noted  ARF - follow- may be prerenal - on Lasix for CHF-   Diasolic CHF, mod Ao stenosis, mod to severe pulm HTN and Right heart failure  Thrombocytopenia -likely also consumption due to acute blood loss- follow    Abnormal QT interval present on electrocardiogram - QTc 570- repeat EKG today - calcium slightly low- replaced - Mg+ is normal    Code Status:     Code Status Orders        Start     Ordered   02/27/15 2327  Full code   Continuous     02/27/15 2326    Advance Directive Documentation        Most Recent Value   Type of Advance Directive  Living will   Pre-existing out of facility DNR order (yellow form or pink MOST form)     "MOST" Form in Place?       Family Communication:  Disposition Plan: follow PT eval DVT prophylaxis: Aspirin per ortho Consultants:Ortho Procedures: right hip repair   Antibiotics: Anti-infectives    Start     Dose/Rate Route Frequency Ordered  Stop   02/28/15 0000  clindamycin (CLEOCIN) IVPB 600 mg     600 mg 100 mL/hr over 30 Minutes Intravenous Every 6 hours 02/27/15 2326 02/28/15 0751   02/27/15 1704  clindamycin (CLEOCIN) 600 MG/50ML IVPB    Comments:  Alanda Amass   : cabinet override      02/27/15 1704 02/27/15 1725      Objective: Filed Weights   02/27/15 1539  Weight: 58.9 kg (129 lb 13.6 oz)    Intake/Output Summary (Last 24 hours) at 02/28/15 0920 Last data filed at 02/28/15 0908  Gross per 24 hour  Intake 2867.5 ml  Output    590 ml  Net 2277.5 ml     Vitals Filed Vitals:   02/27/15 2050 02/27/15 2301 02/28/15 0330 02/28/15 0712  BP: 100/66 113/58 86/43 88/54   Pulse: 86 100 104 83  Temp: 97.5 F (36.4 C) 97.7 F (36.5 C) 98.6 F (37 C) 98.7 F (37.1 C)  TempSrc:  Oral Oral Oral  Resp: Height:      Weight:      SpO2: 100% 97% 98% 100%    Exam:  General:  Pt is alert, not in acute distress  HEENT: No icterus, No thrush, oral mucosa moist  Cardiovascular: regular rate and rhythm, S1/S2 No murmur  Respiratory:  clear to auscultation bilaterally   Abdomen: Soft, +Bowel sounds, non tender, non distended, no guarding  MSK: No LE edema, cyanosis or clubbing- incisions on right hip are clean  Data Reviewed: Basic Metabolic Panel:  Recent Labs Lab 02/27/15 1218 02/27/15 1237 02/28/15 0206  NA 136 138 136  K 4.6 4.4 4.7  CL 102 100* 102  CO2 28  --  25  GLUCOSE 137* 130* 174*  BUN 29* 39* 34*  CREATININE 1.09 1.00 1.54*  CALCIUM 8.5*  --  8.6*  MG 2.1  --   --    Liver Function Tests:  Recent Labs Lab 02/27/15 1218  AST 33  ALT 16*  ALKPHOS 103  BILITOT 1.6*  PROT 6.4*  ALBUMIN 3.3*   No results for input(s): LIPASE, AMYLASE in the last 168 hours. No results for input(s): AMMONIA in the last 168 hours. CBC:  Recent Labs Lab 02/27/15 1218 02/27/15 1237 02/28/15 0206 02/28/15 0800  WBC 10.5  --  9.4 8.5  NEUTROABS 9.2*  --   --   --   HGB 11.5*  12.9* 7.9* 7.5*  HCT 34.3* 38.0* 22.6* 22.3*  MCV 99.7  --  99.1 99.6  PLT 173  --  117* 109*   Cardiac Enzymes: No results for input(s): CKTOTAL, CKMB, CKMBINDEX, TROPONINI in the last 168 hours. BNP (last 3 results)  Recent Labs  08/11/14 1035  BNP 320.9*    ProBNP (last 3 results) No results for input(s): PROBNP in the last 8760 hours.  CBG: No results for input(s): GLUCAP in the last 168 hours.  Recent Results (from the past 240 hour(s))  Surgical pcr screen     Status: None   Collection Time: 02/27/15  5:11 PM  Result Value Ref Range Status   MRSA, PCR NEGATIVE NEGATIVE Final   Staphylococcus aureus NEGATIVE NEGATIVE Final    Comment:        The Xpert SA Assay (FDA approved for NASAL specimens in patients over 79 years of age), is one component of a comprehensive surveillance program.  Test performance has been validated by [facility] for patients greater than or equal to 79 year old. It is not intended to diagnose infection nor to guide or monitor treatment.      Studies: Ct Head Wo Contrast  02/27/2015  CLINICAL DATA:  Post fall EXAM: CT HEAD WITHOUT CONTRAST CT CERVICAL SPINE WITHOUT CONTRAST TECHNIQUE: Multidetector CT imaging of the head and cervical spine was performed following the standard protocol without intravenous contrast. Multiplanar CT image reconstructions of the cervical spine were also generated. COMPARISON:  Head CT - 09/19/2010 ; brain MRI - 05/18/2010 FINDINGS: CT HEAD FINDINGS Regional soft tissues appear normal. No radiopaque foreign body. No displaced calvarial fracture. Similar findings of advanced atrophy with sulcal prominence and centralized volume loss with commensurate ex vacuo dilatation of the ventricular system, asymmetrically involving the occipital horn of left lateral ventricle, unchanged. Scattered periventricular hypodensities compatible microvascular ischemic disease. Given extensive background parenchymal abnormalities, there  is no CT evidence of superimposed acute large territory infarct. No intraparenchymal or extra-axial mass or hemorrhage. Unchanged configuration of ventricles and basilar cisterns. No midline shift. Intracranial atherosclerosis. Limited visualization the paranasal sinuses and mastoid air cells is normal. No air-fluid levels. Post bilateral cataract surgery. CT CERVICAL SPINE FINDINGS C1 to the superior endplate of T3 is imaged. Accentuated cervical lordosis without anterolisthesis or retrolisthesis. The bilateral facets are normally aligned. The dens is normally positioned between the lateral masses of C1.  Moderate degenerative change of the atlantodental articulation. Normal atlantoaxial articulations. No fracture or static subluxation of the cervical spine. Cervical vertebral body heights are preserved. Prevertebral soft tissues are normal. Mild to moderate multilevel cervical spine DDD, worse at C5-C6 and to a lesser extent, C3-C4 and C6-C7 with disc space height loss, endplate irregularity and sclerosis. Atherosclerotic plaque within the bilateral carotid bulbs. No bulky cervical lymphadenopathy on this noncontrast examination. Limited visualization of the lung apices is normal. IMPRESSION: 1. Similar findings of advanced atrophy and microvascular ischemic disease without acute intracranial process. 2. No fracture or static subluxation of the cervical spine. 3. Accentuated cervical lordosis without anterolisthesis. 4. Mild-to-moderate multilevel cervical spine DDD, worse at C5-C6. 5. Atherosclerotic plaque within the bilateral carotid bulbs. Further evaluation with nonemergent carotid Doppler ultrasound could be performed as clinically indicated. Electronically Signed   By: Simonne Come M.D.   On: 02/27/2015 13:43   Ct Cervical Spine Wo Contrast  02/27/2015  CLINICAL DATA:  Post fall EXAM: CT HEAD WITHOUT CONTRAST CT CERVICAL SPINE WITHOUT CONTRAST TECHNIQUE: Multidetector CT imaging of the head and cervical  spine was performed following the standard protocol without intravenous contrast. Multiplanar CT image reconstructions of the cervical spine were also generated. COMPARISON:  Head CT - 09/19/2010 ; brain MRI - 05/18/2010 FINDINGS: CT HEAD FINDINGS Regional soft tissues appear normal. No radiopaque foreign body. No displaced calvarial fracture. Similar findings of advanced atrophy with sulcal prominence and centralized volume loss with commensurate ex vacuo dilatation of the ventricular system, asymmetrically involving the occipital horn of left lateral ventricle, unchanged. Scattered periventricular hypodensities compatible microvascular ischemic disease. Given extensive background parenchymal abnormalities, there is no CT evidence of superimposed acute large territory infarct. No intraparenchymal or extra-axial mass or hemorrhage. Unchanged configuration of ventricles and basilar cisterns. No midline shift. Intracranial atherosclerosis. Limited visualization the paranasal sinuses and mastoid air cells is normal. No air-fluid levels. Post bilateral cataract surgery. CT CERVICAL SPINE FINDINGS C1 to the superior endplate of T3 is imaged. Accentuated cervical lordosis without anterolisthesis or retrolisthesis. The bilateral facets are normally aligned. The dens is normally positioned between the lateral masses of C1. Moderate degenerative change of the atlantodental articulation. Normal atlantoaxial articulations. No fracture or static subluxation of the cervical spine. Cervical vertebral body heights are preserved. Prevertebral soft tissues are normal. Mild to moderate multilevel cervical spine DDD, worse at C5-C6 and to a lesser extent, C3-C4 and C6-C7 with disc space height loss, endplate irregularity and sclerosis. Atherosclerotic plaque within the bilateral carotid bulbs. No bulky cervical lymphadenopathy on this noncontrast examination. Limited visualization of the lung apices is normal. IMPRESSION: 1. Similar  findings of advanced atrophy and microvascular ischemic disease without acute intracranial process. 2. No fracture or static subluxation of the cervical spine. 3. Accentuated cervical lordosis without anterolisthesis. 4. Mild-to-moderate multilevel cervical spine DDD, worse at C5-C6. 5. Atherosclerotic plaque within the bilateral carotid bulbs. Further evaluation with nonemergent carotid Doppler ultrasound could be performed as clinically indicated. Electronically Signed   By: Simonne Come M.D.   On: 02/27/2015 13:43   [name]  02/27/2015  CLINICAL DATA:  Larey Seat today. EXAM: PORTABLE CHEST 1 VIEW COMPARISON:  Multiple prior chest x-rays and a chest CT from 08/13/2014 FINDINGS: Examination is limited by severe rotation of the patient. The heart is grossly normal in size and there is tortuosity, ectasia and calcification of the thoracic aorta. Chronic severe right-sided pleural thickening and/or pleural effusion with overlying atelectasis and/or scarring. The left lung is  clear. Six, seventh and eighth right rib fractures are noted but these appear to be chronic based on the prior CT scan. IMPRESSION: Chronic right pleural effusion, pleural thickening and overlying atelectasis or scarring. The left lung is clear. Remote healed right rib fractures. Electronically Signed   By: Rudie Meyer M.D.   On: 02/27/2015 13:25   Dg C-arm 1-60 Min  02/27/2015  CLINICAL DATA:  ORIF of intertrochanteric right femur fracture EXAM: DG C-ARM 61-120 MIN; RIGHT FEMUR 2 VIEWS COMPARISON:  Right femur radiographs from earlier today FINDINGS: Fluoroscopy time 1 minutes 49.5 seconds. 5 spot fluoroscopic nondiagnostic intraoperative radiographs of the right femur demonstrate intra medullary rod with proximal interlocking right femoral neck pains and distal interlocking screw transfixing intratrochanteric right proximal femur fracture. IMPRESSION: Intraoperative fluoroscopic guidance for ORIF right femur intratrochanteric  fracture. Electronically Signed   By: Delbert Phenix M.D.   On: 02/27/2015 19:28   Dg Hip Unilat With Pelvis 2-3 Views Right  02/27/2015  CLINICAL DATA:  Right hip pain after falling on the floor at home today. EXAM: DG HIP (WITH OR WITHOUT PELVIS) 2-3V RIGHT COMPARISON:  None. FINDINGS: Comminuted right intertrochanteric and subtrochanteric fracture with varus angulation. Diffuse osteopenia. IMPRESSION: Comminuted right intertrochanteric and subtrochanteric fracture. Electronically Signed   By: Beckie Salts M.D.   On: 02/27/2015 12:29   Dg Femur, Min 2 Views Right  02/27/2015  CLINICAL DATA:  ORIF of intertrochanteric right femur fracture EXAM: DG C-ARM 61-120 MIN; RIGHT FEMUR 2 VIEWS COMPARISON:  Right femur radiographs from earlier today FINDINGS: Fluoroscopy time 1 minutes 49.5 seconds. 5 spot fluoroscopic nondiagnostic intraoperative radiographs of the right femur demonstrate intra medullary rod with proximal interlocking right femoral neck pains and distal interlocking screw transfixing intratrochanteric right proximal femur fracture. IMPRESSION: Intraoperative fluoroscopic guidance for ORIF right femur intratrochanteric fracture. Electronically Signed   By: Delbert Phenix M.D.   On: 02/27/2015 19:28   Dg Femur Port, Min 2 Views Right  02/27/2015  CLINICAL DATA:  Unwitnessed fall at home today.  Right hip pain. EXAM: RIGHT FEMUR PORTABLE 1 VIEW COMPARISON:  None. FINDINGS: There is a severely comminuted intertrochanteric fracture of the proximal right femur with angulation and some over riding. There is severe osteopenia. Distal femur is intact. IMPRESSION: Comminuted angulated over riding intertrochanteric fracture of the proximal right femur. Electronically Signed   By: Francene Boyers M.D.   On: 02/27/2015 13:45    Scheduled Meds:  Scheduled Meds: . sodium chloride   Intravenous Once  . aspirin EC  325 mg Oral BID  . donepezil  10 mg Oral QHS  . dutasteride  0.5 mg Oral QHS  . enoxaparin  (LOVENOX) injection  40 mg Subcutaneous Q24H  . furosemide  40 mg Oral Daily  . memantine  10 mg Oral BID  . sertraline  25 mg Oral Daily  . tamsulosin  0.4 mg Oral QHS   Continuous Infusions: . sodium chloride 10 mL/hr at 02/28/15 0908    Time spent on care of this patient: 35 min   Arlon Bleier, MD 02/28/2015, 9:20 AM  LOS: 1 day   [facility] Office  [phone] Pager - Text Page per [email or web address] If 7PM-7AM, please contact night-coverage [email or web address]

## 2015-02-28 NOTE — Progress Notes (Signed)
Pt correctly identified by name his wife, son, and daughter in law.

## 2015-02-28 NOTE — Progress Notes (Signed)
Dr. Butler Denmarkizwan paged to let the doctor know the post-transfusion H&H is back.

## 2015-02-28 NOTE — Progress Notes (Signed)
UR COMPLETED  

## 2015-02-28 NOTE — Evaluation (Signed)
Physical Therapy Evaluation Patient Details Name: Francis Thomas MRN: [id number] DOB: 05/14/1924 Today's Date: 02/28/2015   History of Present Illness  Francis Thomas is a 81 y.o. male with PMH of a-fib, osteoporosis, Gibert's Syndrome, depression, TIAs and dementia who presents after a fall at home and is found to have a right hip fracture. He underwent and intramedullary implant placement on 11/6.  Clinical Impression  Patient is s/p above surgery resulting in functional limitations due to the deficits listed below (see PT Problem List). Mr. Glennon HamiltonHoller was pleasantly confused and agreeable to work w/ therapy.  He requires +2 max assist for sit<>stand transfers and is unable to adhere to TDWB status.  He is from home w/ his wife who is no longer able to provide the physical assist the pt requires.  Pt will need to go to SNF at d/c for additional rehab.  Patient will benefit from skilled PT to increase their independence and safety with mobility to allow discharge to the venue listed below.      Follow Up Recommendations SNF;Supervision/Assistance - 24 hour    Equipment Recommendations  Other (comment) (TBD at next venue of care. )    Recommendations for Other Services       Precautions / Restrictions Precautions Precautions: Fall Precaution Comments: Dementia at baseline.  Denies pain throughout majority of session, family says he typically says "no" even when he is likely in pain Restrictions Weight Bearing Restrictions: Yes RLE Weight Bearing: Touchdown weight bearing      Mobility  Bed Mobility Overal bed mobility: Needs Assistance;+2 for physical assistance Bed Mobility: Supine to Sit     Supine to sit: Mod assist;+2 for physical assistance;HOB elevated     General bed mobility comments: HOB elevated and provided pt verbal and tactile cues to use railing to assist w/ supine>sit.  Assist provided using bed pad to scoot pt to EOB and managing Bil LEs.  Transfers Overall transfer  level: Needs assistance Equipment used: Rolling walker (2 wheeled);2 person hand held assist Transfers: Sit to/from Stand Sit to Stand: Max assist;+2 physical assistance;+2 safety/equipment         General transfer comment: Assist powering up to stand and stabilizing RW as pt pulls up on RW.  Max assist to adhere to WB status which pt is unable to do.  Bed moved from behind pt and recliner placed behind so pt can sit up.    Ambulation/Gait                Stairs            Wheelchair Mobility    Modified Rankin (Stroke Patients Only)       Balance Overall balance assessment: Needs assistance;History of Falls Sitting-balance support: Bilateral upper extremity supported;Feet supported Sitting balance-Leahy Scale: Fair Sitting balance - Comments: Min guard for pt's safety   Standing balance support: Bilateral upper extremity supported;During functional activity Standing balance-Leahy Scale: Poor Standing balance comment: Relies on RW and physical assist for support                             Pertinent Vitals/Pain Pain Assessment: Faces Faces Pain Scale: Hurts even more Pain Location: Rt hip (pt holding Rt hip) Pain Descriptors / Indicators: Grimacing;Guarding;Aching Pain Intervention(s): Limited activity within patient's tolerance;Monitored during session;Repositioned    Home Living Family/patient expects to be discharged to:: Skilled nursing facility [facility] Living Arrangements: Spouse/significant other Available Help at Discharge: Family;Available  24 hours/day;Personal care attendant (wife) Type of Home: House         Home Equipment: Gilmer Mor - single point;Walker - 2 wheels;Wheelchair - manual [facility] currently not being used) Additional Comments: Personal Care attendant comes 3-4 afternoons per week and assist w/ bathing pt and other ADLs.  Another care attendant comes 3 nights/wk to supervise pt so that wife can sleep well.    Prior Function  Level of Independence: Needs assistance   Gait / Transfers Assistance Needed: PTA pt using RW but needs assist w/ all transfers  ADL's / Homemaking Assistance Needed: Assist from wife w/ dressing, cooking, cleaning.  Assist from care attendant for bathing.  Comments: Per pt's son, pt's wife has injured her back assisting the pt and she should probably not even be taking care of pt as it poses a risk to her.  Pt's family has already begun discussing seeking 24/7 care assist at home once pt is d/c from SNF.     Hand Dominance        Extremity/Trunk Assessment   Upper Extremity Assessment: Generalized weakness           Lower Extremity Assessment: Generalized weakness         Communication   Communication: HOH  Cognition Arousal/Alertness: Awake/alert Behavior During Therapy: WFL for tasks assessed/performed Overall Cognitive Status: History of cognitive impairments - at baseline Area of Impairment: Orientation;Memory;Following commands;Safety/judgement;Awareness Orientation Level: Disoriented to;Place;Time;Situation   Memory: Decreased short-term memory;Decreased recall of precautions Following Commands: Follows one step commands inconsistently;Follows one step commands with increased time Safety/Judgement: Decreased awareness of safety;Decreased awareness of deficits Awareness: Emergent   General Comments: When pt asked why he is here he says, "Well, because you got together with my dad"    General Comments      Exercises General Exercises - Lower Extremity Ankle Circles/Pumps: AROM;Both;Supine;10 reps      Assessment/Plan    PT Assessment Patient needs continued PT services  PT Diagnosis Difficulty walking;Abnormality of gait;Generalized weakness;Acute pain   PT Problem List Decreased strength;Decreased range of motion;Decreased activity tolerance;Decreased balance;Decreased mobility;Decreased cognition;Decreased coordination;Decreased knowledge of use of  DME;Decreased safety awareness;Decreased knowledge of precautions;Decreased skin integrity;Pain  PT Treatment Interventions DME instruction;Gait training;Functional mobility training;Therapeutic activities;Therapeutic exercise;Balance training;Neuromuscular re-education;Cognitive remediation;Patient/family education;Wheelchair mobility training;Modalities   PT Goals (Current goals can be found in the Care Plan section) Acute Rehab PT Goals Patient Stated Goal: pt unable to state PT Goal Formulation: With family Time For Goal Achievement: 03/21/15 Potential to Achieve Goals: Fair    Frequency Min 3X/week   Barriers to discharge Decreased caregiver support Wife no long physically able to provide assist pt requires    Co-evaluation               End of Session Equipment Utilized During Treatment: Gait belt Activity Tolerance: Patient tolerated treatment well Patient left: in chair;with call bell/phone within reach;with chair alarm set Nurse Communication: Mobility status;Precautions;Weight bearing status;Other (comment) (chair alarm set)         Time: 8657-8469 PT Time Calculation (min) (ACUTE ONLY): 20 min   Charges:   PT Evaluation $Initial PT Evaluation Tier I: 1 Procedure     PT G Codes:       Michail Jewels PT, DPT [phone] Pager: [phone] 02/28/2015, 1:22 PM

## 2015-02-28 NOTE — Progress Notes (Signed)
   Subjective:  Patient is pleasantly demented  Objective:   VITALS:   Filed Vitals:   02/27/15 2050 02/27/15 2301 02/28/15 0330 02/28/15 0712  BP: 100/66 113/58 86/43 88/54   Pulse: 86 100 104 83  Temp: 97.5 F (36.4 C) 97.7 F (36.5 C) 98.6 F (37 C)   TempSrc:  Oral Oral   Resp: 21 20 14 15   Height:      Weight:      SpO2: 100% 97% 98% 100%    Neurovascular intact Sensation intact distally Intact pulses distally Dorsiflexion/Plantar flexion intact Incision: dressing C/D/I and no drainage No cellulitis present Compartment soft   Lab Results  Component Value Date   WBC 9.4 02/28/2015   HGB 7.9* 02/28/2015   HCT 22.6* 02/28/2015   MCV 99.1 02/28/2015   PLT 117* 02/28/2015     Assessment/Plan:  1 Day Post-Op   - Expected postop acute blood loss anemia - will monitor for symptoms - recommend keeping Hgb above 8.0 - Up with PT/OT - DVT ppx - SCDs, ambulation, asa - TDWB operative extremity - Pain control - Discharge planning - will need SNF  Cheral AlmasXu, Negan Grudzien Michael 02/28/2015, 7:55 AM [phone]

## 2015-03-01 DIAGNOSIS — N179 Acute kidney failure, unspecified: Secondary | ICD-10-CM

## 2015-03-01 DIAGNOSIS — I482 Chronic atrial fibrillation: Secondary | ICD-10-CM

## 2015-03-01 DIAGNOSIS — I952 Hypotension due to drugs: Secondary | ICD-10-CM

## 2015-03-01 LAB — BASIC METABOLIC PANEL
Anion gap: 8 (ref 5–15)
BUN: 53 mg/dL — AB (ref 6–20)
CHLORIDE: 102 mmol/L (ref 101–111)
CO2: 25 mmol/L (ref 22–32)
Calcium: 8.4 mg/dL — ABNORMAL LOW (ref 8.9–10.3)
Creatinine, Ser: 2.11 mg/dL — ABNORMAL HIGH (ref 0.61–1.24)
GFR calc Af Amer: 31 mL/min — ABNORMAL LOW (ref >60)
GFR calc non Af Amer: 26 mL/min — ABNORMAL LOW (ref >60)
GLUCOSE: 127 mg/dL — AB (ref 65–99)
POTASSIUM: 4.6 mmol/L (ref 3.5–5.1)
SODIUM: 135 mmol/L (ref 135–145)

## 2015-03-01 LAB — CBC
HCT: 24.5 % — ABNORMAL LOW (ref 39.0–52.0)
HEMOGLOBIN: 8.2 g/dL — AB (ref 13.0–17.0)
MCH: 32.2 pg (ref 26.0–34.0)
MCHC: 33.5 g/dL (ref 30.0–36.0)
MCV: 96.1 fL (ref 78.0–100.0)
Platelets: 112 10*3/uL — ABNORMAL LOW (ref 150–400)
RBC: 2.55 MIL/uL — AB (ref 4.22–5.81)
RDW: 14.4 % (ref 11.5–15.5)
WBC: 8.9 10*3/uL (ref 4.0–10.5)

## 2015-03-01 MED ORDER — ASPIRIN EC 325 MG PO TBEC
325.0000 mg | DELAYED_RELEASE_TABLET | Freq: Every day | ORAL | Status: DC
  Administered 2015-03-01 – 2015-03-03 (×3): 325 mg via ORAL
  Filled 2015-03-01 (×4): qty 1

## 2015-03-01 MED ORDER — HYDRALAZINE HCL 20 MG/ML IJ SOLN
INTRAMUSCULAR | Status: AC
  Filled 2015-03-01: qty 1

## 2015-03-01 MED ORDER — SODIUM CHLORIDE 0.9 % IV SOLN
INTRAVENOUS | Status: DC
  Administered 2015-03-01: via INTRAVENOUS
  Administered 2015-03-01: 75 mL/h via INTRAVENOUS
  Administered 2015-03-02 – 2015-03-03 (×2): via INTRAVENOUS

## 2015-03-01 MED ORDER — SODIUM CHLORIDE 0.9 % IV BOLUS (SEPSIS)
250.0000 mL | Freq: Once | INTRAVENOUS | Status: AC
  Administered 2015-03-01: 250 mL via INTRAVENOUS

## 2015-03-01 NOTE — Progress Notes (Signed)
   Subjective:  Patient is comfortable  Objective:   VITALS:   Filed Vitals:   02/28/15 2324 03/01/15 0000 03/01/15 0409 03/01/15 0411  BP:  110/55  128/49  Pulse:  82  92  Temp: 99.6 F (37.6 C)  98.2 F (36.8 C)   TempSrc: Oral  Oral   Resp:  13  21  Height:      Weight:      SpO2:  99%  96%    Neurovascular intact Sensation intact distally Intact pulses distally Dorsiflexion/Plantar flexion intact Incision: dressing C/D/I and no drainage No cellulitis present Compartment soft   Lab Results  Component Value Date   WBC 8.9 03/01/2015   HGB 8.2* 03/01/2015   HCT 24.5* 03/01/2015   MCV 96.1 03/01/2015   PLT 112* 03/01/2015     Assessment/Plan:  2 Days Post-Op   - Expected postop acute blood loss anemia - will monitor for symptoms  - Up with PT/OT - DVT ppx - SCDs, ambulation, asa - TDWB operative extremity - Pain control - Discharge planning - SNF  Francis Thomas, Francis Thomas 03/01/2015, 7:48 AM [phone]

## 2015-03-01 NOTE — Evaluation (Signed)
Occupational Therapy Evaluation Patient Details Name: Francis Thomas MRN: [id number] DOB: 05/16/1924 Today's Date: 03/01/2015    History of Present Illness Francis Thomas is a 81 y.o. male with PMH of a-fib, osteoporosis, Gibert's Syndrome, depression, TIAs and dementia who presents after a fall at home and is found to have a right hip fracture. He underwent and intramedullary implant placement on 11/6.   Clinical Impression   Limited evaluation due to hypotension, pt receiving IV fluids.  Pt was able to self feed with supervision, perform some grooming and ambulate with assist and a RW prior to admission.  He was otherwise dependent in ADL and assisted by his wife and a caregiver.  Pt currently requires +2 assist for mobility. He will need SNF for rehab prior to return home.    Follow Up Recommendations  SNF;Supervision/Assistance - 24 hour    Equipment Recommendations       Recommendations for Other Services       Precautions / Restrictions Precautions Precautions: Fall Precaution Comments: Dementia at baseline.  Denies pain throughout majority of session, family says he typically says "no" even when he is likely in pain Restrictions Weight Bearing Restrictions: Yes RLE Weight Bearing: Touchdown weight bearing      Mobility Bed Mobility Overal bed mobility: Needs Assistance;+2 for physical assistance Bed Mobility: Rolling Rolling: +2 for physical assistance;Max assist         General bed mobility comments: +2 total assist to pull up in bed  Transfers                      Balance                                            ADL Overall ADL's : Needs assistance/impaired Eating/Feeding: Minimal assistance;Bed level   Grooming: Wash/dry hands;Wash/dry face;Bed level;Supervision/safety;Set up                                 General ADL Comments: Pt requires max to total assist at baseline for bathing and dressing.  Did not perform  transfers due to hypotension.     Vision Additional Comments: glasses are at home   Perception     Praxis      Pertinent Vitals/Pain Pain Assessment: Faces Faces Pain Scale: Hurts little more Pain Location: R LE Pain Descriptors / Indicators: Grimacing Pain Intervention(s): Limited activity within patient's tolerance;Monitored during session;Repositioned     Hand Dominance Right   Extremity/Trunk Assessment Upper Extremity Assessment Upper Extremity Assessment: Generalized weakness   Lower Extremity Assessment Lower Extremity Assessment: Defer to PT evaluation       Communication Communication Communication: HOH   Cognition Arousal/Alertness: Awake/alert Behavior During Therapy: WFL for tasks assessed/performed Overall Cognitive Status: History of cognitive impairments - at baseline                 General Comments: Pt cooperative, following commands, oriented to himself only.   General Comments       Exercises       Shoulder Instructions      Home Living Family/patient expects to be discharged to:: Skilled nursing facility ([facility] Living Arrangements: Spouse/significant other  Additional Comments: Personal Care attendant comes 3-4 afternoons per week and assist w/ bathing pt and other ADLs.  Another care attendant comes 3 nights/wk to supervise pt so that wife can sleep well.      Prior Functioning/Environment Level of Independence: Needs assistance  Gait / Transfers Assistance Needed: PTA pt using RW but needs assist w/ all transfers ADL's / Homemaking Assistance Needed: Assisted for bathing, dressing, toileting and all IADL. Pt could perform some grooming and could self feed with supervision for swallowing precautions. Communication / Swallowing Assistance Needed: per daughter, pt has to be cued to slow eating pace and to swallow between bites. Comments: Per pt's son, pt's wife has  injured her back assisting the pt and she should probably not even be taking care of pt as it poses a risk to her.  Pt's family has already begun discussing seeking 24/7 care assist at home once pt is d/c from SNF.    OT Diagnosis: Generalized weakness;Cognitive deficits;Acute pain   OT Problem List:     OT Treatment/Interventions:      OT Goals(Current goals can be found in the care plan section) Acute Rehab OT Goals Patient Stated Goal: get stronger  OT Frequency:     Barriers to D/C:            Co-evaluation              End of Session    Activity Tolerance: Treatment limited secondary to medical complications (Comment) (hypotension) Patient left: in bed;with bed alarm set;with call bell/phone within reach;with family/visitor present   Time: 1610-9604 OT Time Calculation (min): 29 min Charges:  OT General Charges $OT Visit: 1 Procedure OT Evaluation $Initial OT Evaluation Tier I: 1 Procedure OT Treatments $Self Care/Home Management : 8-22 mins G-Codes:    Evern Bio 03/01/2015, 10:25 AM  [phone]

## 2015-03-01 NOTE — Care Management Note (Addendum)
Case Management Note  Patient Details  Name: Francis BeersZeb N Bernath MRN: [id number] Date of Birth: 12/20/1924  Subjective/Objective:                Admitted s/p fall, s/p INTRAMEDULLARY (IM) NAIL INTERTROCHANTRIC (Right). From home with wife. Independent with ADL's  PTA. DME: walker.     Action/Plan:   SNF/REHAB. CM to f/u with disposition needs.  Expected Discharge Date:                  Expected Discharge Plan:  Skilled Nursing Facility  In-House Referral:  Clinical Social Work  Discharge planning Services  CM Consult  Post Acute Care Choice:    Choice offered to:     DME Arranged:    DME Agency:     HH Arranged:    HH Agency:     Status of Service:  In process, will continue to follow  Medicare Important Message Given:    Date Medicare IM Given:    Medicare IM give by:    Date Additional Medicare IM Given:    Additional Medicare Important Message give by:     If discussed at Long Length of Stay Meetings, dates discussed:    Additional Comments: Rona RavensCharlene Arko (Spouse) [phone],  Daune PerchCharlene L Mera  [phone]   Gae GallopCole, Tarrance Januszewski [place], [place],BSN,CM [phone] 03/01/2015, 11:46 AM

## 2015-03-01 NOTE — Progress Notes (Addendum)
[facility] Progress Note   Francis Thomas  BJY:[id number]  DOB: 08/28/1924  DOA: 02/27/2015 PCP: Ginette OttoSTONEKING,HAL THOMAS, MD  Brief narrative: Francis Thomas is a 81 y.o. male with PMH of A-fib on aspirin, Gibert's Syndrome, TIAs and dementia who presents after a fall at home and is found to have a right hip fracture. He underwent and intramedullary implant placement on 11/6.   Subjective: Hypotensive this AM. He has no complaints other than weakness. No nausea, vomiting, abdominal pain, cough, constipation or diarrhea.   Assessment/Plan: Principal Problem:   Intertrochanteric fracture of right hip  - s/p repair- management per orthopedic service - Pt eval- SNF recommended- social work consulted  Active Problems:   Dementia - stable-oriented to situation- no behavioral disturbanced noted thus far    Atrial fibrillation  - on ASA 325 mg daily for anticoagulation at home - currently on ASA 325 BID for DVT prophylaxis after hip repair - rate controlled  Anemia - acute on chronic due to acute blood loss- drop from 11.5 to 7.5 (rechecked to ensure accuracy) - transfused 1 U PRBC ton 11/7- Hb improved to 8.2  ARF on CKD3 & hypotension - BP 87/30 this AM - per RN, he is eating well but drinking poorly - stop Lasix- hydrate slowly today and follow Bmet, daily weights, respiratory status and I and O in SDU  Diasolic CHF, mod Ao stenosis, mod to severe pulm HTN and Right heart failure - holding Lasix and hydrating slowly - follow  Thrombocytopenia -likely also consumption due to acute blood loss- slightly improved today    Abnormal QT interval present on electrocardiogram - QTc 570- noted to be slightly hypocalcemic yesterday - repeat EKG after calcium replacement shows improvement to 490 today - Mg+ is normal    Code Status:     Code Status Orders        Start     Ordered   02/27/15 2327  Full code   Continuous     02/27/15 2326    Advance Directive Documentation         Most Recent Value   Type of Advance Directive  Living will   Pre-existing out of facility DNR order (yellow form or pink MOST form)     "MOST" Form in Place?       Family Communication:  Disposition Plan: follow PT eval- transfer to tele once BP improves- SNF bed search DVT prophylaxis: Aspirin per ortho Consultants:Ortho Procedures: right hip repair   Antibiotics: Anti-infectives    Start     Dose/Rate Route Frequency Ordered Stop   02/28/15 0000  clindamycin (CLEOCIN) IVPB 600 mg     600 mg 100 mL/hr over 30 Minutes Intravenous Every 6 hours 02/27/15 2326 02/28/15 0751   02/27/15 1704  clindamycin (CLEOCIN) 600 MG/50ML IVPB    Comments:  Alanda AmassFriedman, Scot   : cabinet override      02/27/15 1704 02/27/15 1725      Objective: Filed Weights   02/27/15 1539  Weight: 58.9 kg (129 lb 13.6 oz)    Intake/Output Summary (Last 24 hours) at 03/01/15 1054 Last data filed at 03/01/15 0800  Gross per 24 hour  Intake    815 ml  Output    150 ml  Net    665 ml     Vitals Filed Vitals:   03/01/15 0755 03/01/15 0800 03/01/15 0808 03/01/15 1000  BP: 95/34 87/30  93/48  Pulse: 79 81  83  Temp:   97.6  F (36.4 C)   TempSrc:   Oral   Resp: Height:      Weight:      SpO2: 100% 98%  99%    Exam:  General:  Pt is alert, not in acute distress  HEENT: No icterus, No thrush, oral mucosa moist  Cardiovascular: regular rate and rhythm, S1/S2 No murmur  Respiratory: clear to auscultation bilaterally   Abdomen: Soft, +Bowel sounds, non tender, non distended, no guarding  MSK: No LE edema, cyanosis or clubbing- incisions on right hip are clean  Data Reviewed: Basic Metabolic Panel:  Recent Labs Lab 02/27/15 1218 02/27/15 1237 02/27/15 1821 02/28/15 0206 02/28/15 1058 03/01/15 0305  NA 136 138 136 136  --  135  K 4.6 4.4 4.2 4.7  --  4.6  CL 102 100*  --  102  --  102  CO2 28  --   --  25  --  25  GLUCOSE 137* 130*  --  174*  --  127*  BUN 29* 39*  --   34*  --  53*  CREATININE 1.09 1.00  --  1.54*  --  2.11*  CALCIUM 8.5*  --   --  8.6*  --  8.4*  MG 2.1  --   --   --  2.1  --    Liver Function Tests:  Recent Labs Lab 02/27/15 1218 02/28/15 1058  AST 33  --   ALT 16*  --   ALKPHOS 103  --   BILITOT 1.6*  --   PROT 6.4*  --   ALBUMIN 3.3* 2.9*   No results for input(s): LIPASE, AMYLASE in the last 168 hours. No results for input(s): AMMONIA in the last 168 hours. CBC:  Recent Labs Lab 02/27/15 1218  02/27/15 1821 02/28/15 0206 02/28/15 0800 02/28/15 1439 03/01/15 0305  WBC 10.5  --   --  9.4 8.5  --  8.9  NEUTROABS 9.2*  --   --   --   --   --   --   HGB 11.5*  < > 9.9* 7.9* 7.5* 8.6* 8.2*  HCT 34.3*  < > 29.0* 22.6* 22.3* 25.7* 24.5*  MCV 99.7  --   --  99.1 99.6  --  96.1  PLT 173  --   --  117* 109*  --  112*  < > = values in this interval not displayed. Cardiac Enzymes: No results for input(s): CKTOTAL, CKMB, CKMBINDEX, TROPONINI in the last 168 hours. BNP (last 3 results)  Recent Labs  08/11/14 1035  BNP 320.9*    ProBNP (last 3 results) No results for input(s): PROBNP in the last 8760 hours.  CBG: No results for input(s): GLUCAP in the last 168 hours.  Recent Results (from the past 240 hour(s))  Surgical pcr screen     Status: None   Collection Time: 02/27/15  5:11 PM  Result Value Ref Range Status   MRSA, PCR NEGATIVE NEGATIVE Final   Staphylococcus aureus NEGATIVE NEGATIVE Final    Comment:        The Xpert SA Assay (FDA approved for NASAL specimens in patients over 22 years of age), is one component of a comprehensive surveillance program.  Test performance has been validated by [facility] for patients greater than or equal to 2 year old. It is not intended to diagnose infection nor to guide or monitor treatment.      Studies: Ct Head Wo Contrast  02/27/2015  CLINICAL DATA:  Post fall EXAM: CT HEAD WITHOUT CONTRAST CT CERVICAL SPINE WITHOUT CONTRAST TECHNIQUE: Multidetector CT  imaging of the head and cervical spine was performed following the standard protocol without intravenous contrast. Multiplanar CT image reconstructions of the cervical spine were also generated. COMPARISON:  Head CT - 09/19/2010 ; brain MRI - 05/18/2010 FINDINGS: CT HEAD FINDINGS Regional soft tissues appear normal. No radiopaque foreign body. No displaced calvarial fracture. Similar findings of advanced atrophy with sulcal prominence and centralized volume loss with commensurate ex vacuo dilatation of the ventricular system, asymmetrically involving the occipital horn of left lateral ventricle, unchanged. Scattered periventricular hypodensities compatible microvascular ischemic disease. Given extensive background parenchymal abnormalities, there is no CT evidence of superimposed acute large territory infarct. No intraparenchymal or extra-axial mass or hemorrhage. Unchanged configuration of ventricles and basilar cisterns. No midline shift. Intracranial atherosclerosis. Limited visualization the paranasal sinuses and mastoid air cells is normal. No air-fluid levels. Post bilateral cataract surgery. CT CERVICAL SPINE FINDINGS C1 to the superior endplate of T3 is imaged. Accentuated cervical lordosis without anterolisthesis or retrolisthesis. The bilateral facets are normally aligned. The dens is normally positioned between the lateral masses of C1. Moderate degenerative change of the atlantodental articulation. Normal atlantoaxial articulations. No fracture or static subluxation of the cervical spine. Cervical vertebral body heights are preserved. Prevertebral soft tissues are normal. Mild to moderate multilevel cervical spine DDD, worse at C5-C6 and to a lesser extent, C3-C4 and C6-C7 with disc space height loss, endplate irregularity and sclerosis. Atherosclerotic plaque within the bilateral carotid bulbs. No bulky cervical lymphadenopathy on this noncontrast examination. Limited visualization of the lung apices is  normal. IMPRESSION: 1. Similar findings of advanced atrophy and microvascular ischemic disease without acute intracranial process. 2. No fracture or static subluxation of the cervical spine. 3. Accentuated cervical lordosis without anterolisthesis. 4. Mild-to-moderate multilevel cervical spine DDD, worse at C5-C6. 5. Atherosclerotic plaque within the bilateral carotid bulbs. Further evaluation with nonemergent carotid Doppler ultrasound could be performed as clinically indicated. Electronically Signed   By: Simonne Come M.D.   On: 02/27/2015 13:43   Ct Cervical Spine Wo Contrast  02/27/2015  CLINICAL DATA:  Post fall EXAM: CT HEAD WITHOUT CONTRAST CT CERVICAL SPINE WITHOUT CONTRAST TECHNIQUE: Multidetector CT imaging of the head and cervical spine was performed following the standard protocol without intravenous contrast. Multiplanar CT image reconstructions of the cervical spine were also generated. COMPARISON:  Head CT - 09/19/2010 ; brain MRI - 05/18/2010 FINDINGS: CT HEAD FINDINGS Regional soft tissues appear normal. No radiopaque foreign body. No displaced calvarial fracture. Similar findings of advanced atrophy with sulcal prominence and centralized volume loss with commensurate ex vacuo dilatation of the ventricular system, asymmetrically involving the occipital horn of left lateral ventricle, unchanged. Scattered periventricular hypodensities compatible microvascular ischemic disease. Given extensive background parenchymal abnormalities, there is no CT evidence of superimposed acute large territory infarct. No intraparenchymal or extra-axial mass or hemorrhage. Unchanged configuration of ventricles and basilar cisterns. No midline shift. Intracranial atherosclerosis. Limited visualization the paranasal sinuses and mastoid air cells is normal. No air-fluid levels. Post bilateral cataract surgery. CT CERVICAL SPINE FINDINGS C1 to the superior endplate of T3 is imaged. Accentuated cervical lordosis without  anterolisthesis or retrolisthesis. The bilateral facets are normally aligned. The dens is normally positioned between the lateral masses of C1. Moderate degenerative change of the atlantodental articulation. Normal atlantoaxial articulations. No fracture or static subluxation of the cervical spine. Cervical vertebral body heights are preserved. Prevertebral soft tissues  are normal. Mild to moderate multilevel cervical spine DDD, worse at C5-C6 and to a lesser extent, C3-C4 and C6-C7 with disc space height loss, endplate irregularity and sclerosis. Atherosclerotic plaque within the bilateral carotid bulbs. No bulky cervical lymphadenopathy on this noncontrast examination. Limited visualization of the lung apices is normal. IMPRESSION: 1. Similar findings of advanced atrophy and microvascular ischemic disease without acute intracranial process. 2. No fracture or static subluxation of the cervical spine. 3. Accentuated cervical lordosis without anterolisthesis. 4. Mild-to-moderate multilevel cervical spine DDD, worse at C5-C6. 5. Atherosclerotic plaque within the bilateral carotid bulbs. Further evaluation with nonemergent carotid Doppler ultrasound could be performed as clinically indicated. Electronically Signed   By: Simonne Come M.D.   On: 02/27/2015 13:43   [name]  02/27/2015  CLINICAL DATA:  Larey Seat today. EXAM: PORTABLE CHEST 1 VIEW COMPARISON:  Multiple prior chest x-rays and a chest CT from 08/13/2014 FINDINGS: Examination is limited by severe rotation of the patient. The heart is grossly normal in size and there is tortuosity, ectasia and calcification of the thoracic aorta. Chronic severe right-sided pleural thickening and/or pleural effusion with overlying atelectasis and/or scarring. The left lung is clear. Six, seventh and eighth right rib fractures are noted but these appear to be chronic based on the prior CT scan. IMPRESSION: Chronic right pleural effusion, pleural thickening and overlying  atelectasis or scarring. The left lung is clear. Remote healed right rib fractures. Electronically Signed   By: Rudie Meyer M.D.   On: 02/27/2015 13:25   Dg C-arm 1-60 Min  02/27/2015  CLINICAL DATA:  ORIF of intertrochanteric right femur fracture EXAM: DG C-ARM 61-120 MIN; RIGHT FEMUR 2 VIEWS COMPARISON:  Right femur radiographs from earlier today FINDINGS: Fluoroscopy time 1 minutes 49.5 seconds. 5 spot fluoroscopic nondiagnostic intraoperative radiographs of the right femur demonstrate intra medullary rod with proximal interlocking right femoral neck pains and distal interlocking screw transfixing intratrochanteric right proximal femur fracture. IMPRESSION: Intraoperative fluoroscopic guidance for ORIF right femur intratrochanteric fracture. Electronically Signed   By: Delbert Phenix M.D.   On: 02/27/2015 19:28   Dg Hip Unilat With Pelvis 2-3 Views Right  02/27/2015  CLINICAL DATA:  Right hip pain after falling on the floor at home today. EXAM: DG HIP (WITH OR WITHOUT PELVIS) 2-3V RIGHT COMPARISON:  None. FINDINGS: Comminuted right intertrochanteric and subtrochanteric fracture with varus angulation. Diffuse osteopenia. IMPRESSION: Comminuted right intertrochanteric and subtrochanteric fracture. Electronically Signed   By: Beckie Salts M.D.   On: 02/27/2015 12:29   Dg Femur, Min 2 Views Right  02/27/2015  CLINICAL DATA:  ORIF of intertrochanteric right femur fracture EXAM: DG C-ARM 61-120 MIN; RIGHT FEMUR 2 VIEWS COMPARISON:  Right femur radiographs from earlier today FINDINGS: Fluoroscopy time 1 minutes 49.5 seconds. 5 spot fluoroscopic nondiagnostic intraoperative radiographs of the right femur demonstrate intra medullary rod with proximal interlocking right femoral neck pains and distal interlocking screw transfixing intratrochanteric right proximal femur fracture. IMPRESSION: Intraoperative fluoroscopic guidance for ORIF right femur intratrochanteric fracture. Electronically Signed   By: Delbert Phenix  M.D.   On: 02/27/2015 19:28   Dg Femur Port, Min 2 Views Right  02/27/2015  CLINICAL DATA:  Unwitnessed fall at home today.  Right hip pain. EXAM: RIGHT FEMUR PORTABLE 1 VIEW COMPARISON:  None. FINDINGS: There is a severely comminuted intertrochanteric fracture of the proximal right femur with angulation and some over riding. There is severe osteopenia. Distal femur is intact. IMPRESSION: Comminuted angulated over riding intertrochanteric fracture of  the proximal right femur. Electronically Signed   By: Francene Boyers M.D.   On: 02/27/2015 13:45    Scheduled Meds:  Scheduled Meds: . aspirin EC  325 mg Oral Daily  . donepezil  10 mg Oral QHS  . dutasteride  0.5 mg Oral QHS  . enoxaparin (LOVENOX) injection  30 mg Subcutaneous Q24H  . hydrALAZINE      . memantine  10 mg Oral BID  . sertraline  25 mg Oral Daily  . tamsulosin  0.4 mg Oral QHS   Continuous Infusions: . sodium chloride 75 mL/hr (03/01/15 0943)    Time spent on care of this patient: 35 min   Shaquita Fort, MD 03/01/2015, 10:54 AM  LOS: 2 days   [facility] Office  [phone] Pager - Text Page per [email or web address] If 7PM-7AM, please contact night-coverage [email or web address]

## 2015-03-01 NOTE — Clinical Social Work Placement (Signed)
   CLINICAL SOCIAL WORK PLACEMENT  NOTE  Date:  03/01/2015  Patient Details  Name: Francis Thomas N Prazak MRN: [id number] Date of Birth: 10/02/1924  Clinical Social Work is seeking post-discharge placement for this patient at the Skilled  Nursing Facility level of care (*CSW will initial, date and re-position this form in  chart as items are completed):  Yes   Patient/family provided with [facility] Social Work Department's list of facilities offering this level of care within the geographic area requested by the patient (or if unable, by the patient's family).  Yes   Patient/family informed of their freedom to choose among providers that offer the needed level of care, that participate in Medicare, Medicaid or managed care program needed by the patient, have an available bed and are willing to accept the patient.  Yes   Patient/family informed of [facility]'s ownership interest in [facility] and [facility], as well as of the fact that they are under no obligation to receive care at these facilities.  PASRR submitted to EDS on       PASRR number received on       Existing PASRR number confirmed on 03/01/15     FL2 transmitted to all facilities in geographic area requested by pt/family on 03/01/15     FL2 transmitted to all facilities within larger geographic area on       Patient informed that his/her managed care company has contracts with or will negotiate with certain facilities, including the following:        Yes   Patient/family informed of bed offers received.  Patient chooses bed at  [facility]     Physician recommends and patient chooses bed at      Patient to be transferred to  [facility] on  .  Patient to be transferred to facility by       Patient family notified on   of transfer.  Name of family member notified:        PHYSICIAN Please sign FL2     Additional Comment:    _______________________________________________ Gwynne EdingerBibbs, Majel Giel,  LCSW 03/01/2015, 11:15 AM

## 2015-03-01 NOTE — Clinical Social Work Note (Signed)
Clinical Social Work Assessment  Patient Details  Name: Francis Thomas MRN: [id number] Date of Birth: 07/11/1924  Date of referral:  02/27/15               Reason for consult:  Facility Placement                Permission sought to share information with:  Family Supports Permission granted to share information::     Name::     [facility]  Relationship::  son   Housing/Transportation Living arrangements for the past 2 months:  Single Family Home Source of Information:  Adult Children Patient Interpreter Needed:  None Criminal Activity/Legal Involvement Pertinent to Current Situation/Hospitalization:  No - Comment as needed Significant Relationships:  Adult Children, Spouse Lives with:  Spouse Do you feel safe going back to the place where you live?  No Need for family participation in patient care:  Yes (Comment)  Care giving concerns: None    Social Worker assessment / plan:  CSW spoke with the pt's son Francis Thomas. CSW introduced self and purpose of the call. CSW discussed SNF rehab. CSW explained insurance and its relation to SNF placement. Francis Thomas reported that he has [facility] a [facility]. Tresa EndoKelly called [facility] to confirm the pt's bed hold. CSW answered all questions in which Francis Thomas inquired about. CSW will continue to follow this pt and assist with discharge as needed.   Employment status:  Retired Health and safety inspectornsurance information:  Medicare PT Recommendations:  Skilled Nursing Facility Information / Referral to community resources:  Skilled Nursing Facility  Patient/Family's Response to care:  Francis Thomas reported that the pt has been cared for well.   Patient/Family's Understanding of and Emotional Response to Diagnosis, Current Treatment, and Prognosis:  Francis Thomas shared that the pt's health has been declining over the last few month. Francis Thomas shared that the pt has been in and out of [facility]. Francis Thomas reported that he and his family will decided on a long-term care plan for the pt.   Emotional  Assessment Appearance:   (Unable to Assess ) Attitude/Demeanor/Rapport:  Unable to Assess Affect (typically observed):  Unable to Assess Orientation:  Oriented to Self Alcohol / Substance use:  Not Applicable Psych involvement (Current and /or in the community):  No (Comment)  Discharge Needs  Concerns to be addressed:  Denies Needs/Concerns at this time Readmission within the last 30 days:  No Current discharge risk:  None Barriers to Discharge:  No Barriers Identified   Chanson Teems, LCSW 03/01/2015, 11:03 AM

## 2015-03-02 DIAGNOSIS — I4891 Unspecified atrial fibrillation: Secondary | ICD-10-CM

## 2015-03-02 DIAGNOSIS — D62 Acute posthemorrhagic anemia: Secondary | ICD-10-CM

## 2015-03-02 DIAGNOSIS — S72141S Displaced intertrochanteric fracture of right femur, sequela: Secondary | ICD-10-CM

## 2015-03-02 LAB — CBC
HEMATOCRIT: 21.7 % — AB (ref 39.0–52.0)
HEMOGLOBIN: 7.2 g/dL — AB (ref 13.0–17.0)
MCH: 32.6 pg (ref 26.0–34.0)
MCHC: 33.2 g/dL (ref 30.0–36.0)
MCV: 98.2 fL (ref 78.0–100.0)
Platelets: 103 10*3/uL — ABNORMAL LOW (ref 150–400)
RBC: 2.21 MIL/uL — AB (ref 4.22–5.81)
RDW: 14.5 % (ref 11.5–15.5)
WBC: 7.7 10*3/uL (ref 4.0–10.5)

## 2015-03-02 LAB — PREPARE RBC (CROSSMATCH)

## 2015-03-02 LAB — HEMOGLOBIN AND HEMATOCRIT, BLOOD
HCT: 26.6 % — ABNORMAL LOW (ref 39.0–52.0)
HEMOGLOBIN: 9.1 g/dL — AB (ref 13.0–17.0)

## 2015-03-02 LAB — CALCIUM, IONIZED: Calcium, Ionized, Serum: 4.8 mg/dL (ref 4.5–5.6)

## 2015-03-02 MED ORDER — ASPIRIN EC 325 MG PO TBEC: 325.0000 mg | DELAYED_RELEASE_TABLET | Freq: Every day | ORAL | Status: AC

## 2015-03-02 MED ORDER — SODIUM CHLORIDE 0.9 % IV BOLUS (SEPSIS)
250.0000 mL | Freq: Once | INTRAVENOUS | Status: AC
  Administered 2015-03-02: 250 mL via INTRAVENOUS

## 2015-03-02 MED ORDER — OXYCODONE-ACETAMINOPHEN 5-325 MG PO TABS: 1.0000 | ORAL_TABLET | ORAL | Status: AC | PRN

## 2015-03-02 MED ORDER — SODIUM CHLORIDE 0.9 % IV SOLN
Freq: Once | INTRAVENOUS | Status: AC
  Administered 2015-03-02: 12:00:00 via INTRAVENOUS

## 2015-03-02 NOTE — Progress Notes (Signed)
   Subjective:  Patient is comfortable  Objective:   VITALS:   Filed Vitals:   03/02/15 0200 03/02/15 0400 03/02/15 0402 03/02/15 0500  BP: 111/42 121/54  126/42  Pulse: 82 78  74  Temp:   98.4 F (36.9 C)   TempSrc:   Oral   Resp: 15 21  17   Height:  5\' 7"  (1.702 m)    Weight:  65.1 kg (143 lb 8.3 oz)    SpO2: 95% 98%  97%    Neurovascular intact Sensation intact distally Intact pulses distally Dorsiflexion/Plantar flexion intact Incision: dressing C/D/I and no drainage No cellulitis present Compartment soft   Lab Results  Component Value Date   WBC 7.7 03/02/2015   HGB 7.2* 03/02/2015   HCT 21.7* 03/02/2015   MCV 98.2 03/02/2015   PLT 103* 03/02/2015     Assessment/Plan:  3 Days Post-Op   - Expected postop acute blood loss anemia - will monitor for symptoms - needs transfusion of at least 1 unit of RBCs - Up with PT/OT - DVT ppx - SCDs, ambulation, asa - TDWB operative extremity - Pain control - Discharge planning - SNF  Francis Thomas, Francis Thomas 03/02/2015, 7:48 AM [phone]

## 2015-03-02 NOTE — Care Management Important Message (Signed)
Important Message  Patient Details  Name: Francis Thomas MRN: [id number] Date of Birth: 07/30/1924   Medicare Important Message Given:  Yes-second notification given    Kyla BalzarineShealy, Nasiah Polinsky Abena 03/02/2015, 9:47 AM

## 2015-03-02 NOTE — Progress Notes (Signed)
Physical Therapy Treatment Patient Details Name: Francis Thomas MRN: [id number] DOB: 03/12/1925 Today's Date: 03/02/2015    History of Present Illness Francis Thomas is a 81 y.o. male with PMH of a-fib, osteoporosis, Gibert's Syndrome, depression, TIAs and dementia who presents after a fall at home and is found to have a right hip fracture. He underwent and intramedullary implant placement on 11/6.    PT Comments    Performed anterior posterior transfer to chair today as pt unable to perform sit<>stand w/o breaking TDWB precautions.  Mod +2 assist for supine>sit but total +2 assist for anterior posterior transfer to chair 2/2 quick fatigue.  Pt will benefit from continued skilled PT services to increase functional independence and safety.   Follow Up Recommendations  SNF;Supervision/Assistance - 24 hour     Equipment Recommendations  Other (comment) (TBD at next venue of care. )    Recommendations for Other Services       Precautions / Restrictions Precautions Precautions: Fall Precaution Comments: Dementia at baseline.  Denies pain throughout majority of session, family says he typically says "no" even when he is likely in pain Restrictions Weight Bearing Restrictions: Yes RLE Weight Bearing: Touchdown weight bearing    Mobility  Bed Mobility Overal bed mobility: Needs Assistance;+2 for physical assistance Bed Mobility: Supine to Sit     Supine to sit: Mod assist;+2 for physical assistance;HOB elevated     General bed mobility comments: HOB elevated and provided pt verbal and tactile cues to use railing to assist w/ supine>sit.  Assist provided using bed pad to scoot and managing Bil LEs.  Transfers Overall transfer level: Needs assistance Equipment used: 2 person hand held assist Transfers: Licensed conveyancerAnterior-Posterior Transfer       Anterior-Posterior transfers: Total assist;+2 physical assistance;+2 safety/equipment   General transfer comment: AP transfer today as pt unable to  adhere to TDWB status w/ sit<>stand.  Pt fatigues quickly.  Initially assist w/ scooting back to chair but requires total +2 assist using bed pad and assisting Rt LE to scoot back to chair.  Ambulation/Gait                 Stairs            Wheelchair Mobility    Modified Rankin (Stroke Patients Only)       Balance Overall balance assessment: Needs assistance Sitting-balance support: Bilateral upper extremity supported Sitting balance-Leahy Scale: Poor Sitting balance - Comments: Mod>max assist when resting during scooting for AP transfer as pt fatigued quickly                            Cognition Arousal/Alertness: Awake/alert Behavior During Therapy: WFL for tasks assessed/performed Overall Cognitive Status: History of cognitive impairments - at baseline Area of Impairment: Orientation;Memory;Following commands;Safety/judgement;Awareness Orientation Level: Disoriented to;Place;Time;Situation   Memory: Decreased short-term memory;Decreased recall of precautions Following Commands: Follows one step commands inconsistently;Follows one step commands with increased time Safety/Judgement: Decreased awareness of safety;Decreased awareness of deficits Awareness: Emergent   General Comments: [facility] of [facility]    Exercises General Exercises - Lower Extremity Ankle Circles/Pumps: AROM;Both;Supine;10 reps Quad Sets: Strengthening;Both;10 reps;Supine    General Comments        Pertinent Vitals/Pain Pain Assessment: Faces Faces Pain Scale: Hurts little more Pain Location: Rt LE Pain Descriptors / Indicators: Grimacing Pain Intervention(s): Limited activity within patient's tolerance;Monitored during session;Repositioned    Home Living  Prior Function            PT Goals (current goals can now be found in the care plan section) Acute Rehab PT Goals Patient Stated Goal: pt unable to  state PT Goal Formulation: With family Time For Goal Achievement: 03/21/15 Potential to Achieve Goals: Fair Progress towards PT goals: Progressing toward goals    Frequency  Min 3X/week    PT Plan Current plan remains appropriate    Co-evaluation             End of Session   Activity Tolerance: Patient limited by fatigue Patient left: in chair;with call bell/phone within reach;with chair alarm set     Time: 1610-9604 PT Time Calculation (min) (ACUTE ONLY): 29 min  Charges:  $Therapeutic Exercise: 8-22 mins $Therapeutic Activity: 8-22 mins                    G Codes:      Michail Jewels PT, [place] [phone] Pager: [phone] 03/02/2015, 2:36 PM

## 2015-03-02 NOTE — Progress Notes (Signed)
[facility] Progress Note   [name]  ZOX:[id number]  DOB: 1924-08-25  DOA: 02/27/2015 PCP: Ginette Otto, MD  Brief narrative: Francis Thomas is a 81 y.o. male with PMH of A-fib on aspirin, Gibert's Syndrome, TIAs and dementia who presents after a fall at home and is found to have a right hip fracture. He underwent and intramedullary implant placement on 11/6.   Subjective: No new complaints reported.   Assessment/Plan: Principal Problem:   Intertrochanteric fracture of right hip  - s/p repair- management per orthopedic service - Pt eval, will transition to SNF once medically stable  Active Problems:   Dementia - stable - no behavioral disturbanced reported    Atrial fibrillation  - on ASA 325 mg daily for anticoagulation- currently on ASA 325 BID for DVT prophylaxis after hip repair  Anemia - acute on chronic due to acute blood loss- drop to 7.2. Will transfuse and reassess hgb levels next am.  ARF - follow- may be prerenal - on Lasix for CHF-   Diasolic CHF, mod Ao stenosis, mod to severe pulm HTN and Right heart failure  Thrombocytopenia -likely also consumption due to acute blood loss- follow    Abnormal QT interval present on electrocardiogram - QTc 570- repeat EKG today - calcium slightly low- replaced - Mg+ is normal    Code Status:     Code Status Orders        Start     Ordered   02/27/15 2327  Full code   Continuous     02/27/15 2326    Advance Directive Documentation        Most Recent Value   Type of Advance Directive  Living will   Pre-existing out of facility DNR order (yellow form or pink MOST form)     "MOST" Form in Place?       Family Communication:  Disposition Plan: to SNF once hgb and blood pressures stable DVT prophylaxis: Aspirin per ortho Consultants:Ortho Procedures: right hip repair   Antibiotics: Anti-infectives    Start     Dose/Rate Route Frequency Ordered Stop   02/28/15 0000  clindamycin  (CLEOCIN) IVPB 600 mg     600 mg 100 mL/hr over 30 Minutes Intravenous Every 6 hours 02/27/15 2326 02/28/15 0751   02/27/15 1704  clindamycin (CLEOCIN) 600 MG/50ML IVPB    Comments:  Alanda Amass   : cabinet override      02/27/15 1704 02/27/15 1725      Objective: Filed Weights   02/27/15 1539 03/01/15 2115 03/02/15 0400  Weight: 58.9 kg (129 lb 13.6 oz) 65.2 kg (143 lb 11.8 oz) 65.1 kg (143 lb 8.3 oz)    Intake/Output Summary (Last 24 hours) at 03/02/15 0950 Last data filed at 03/02/15 0900  Gross per 24 hour  Intake 2256.25 ml  Output    975 ml  Net 1281.25 ml     Vitals Filed Vitals:   03/02/15 0402 03/02/15 0500 03/02/15 0800 03/02/15 0813  BP:  126/42 107/89   Pulse:  74 80   Temp: 98.4 F (36.9 C)   97.6 F (36.4 C)  TempSrc: Oral   Oral  Resp:  17 14   Height:      Weight:      SpO2:  97% 99%     Exam:  General:  Pt is alert, not in acute distress  HEENT: No icterus, No thrush, oral mucosa moist  Cardiovascular: regular rate and rhythm, S1/S2 No murmur  Respiratory:  clear to auscultation bilaterally, no wheezes  Abdomen: Soft, +Bowel sounds, non tender, non distended, no guarding  MSK: No LE edema, cyanosis or clubbing- incisions on right hip are clean  Data Reviewed: Basic Metabolic Panel:  Recent Labs Lab 02/27/15 1218 02/27/15 1237 02/27/15 1821 02/28/15 0206 02/28/15 1058 03/01/15 0305  NA 136 138 136 136  --  135  K 4.6 4.4 4.2 4.7  --  4.6  CL 102 100*  --  102  --  102  CO2 28  --   --  25  --  25  GLUCOSE 137* 130*  --  174*  --  127*  BUN 29* 39*  --  34*  --  53*  CREATININE 1.09 1.00  --  1.54*  --  2.11*  CALCIUM 8.5*  --   --  8.6*  --  8.4*  MG 2.1  --   --   --  2.1  --    Liver Function Tests:  Recent Labs Lab 02/27/15 1218 02/28/15 1058  AST 33  --   ALT 16*  --   ALKPHOS 103  --   BILITOT 1.6*  --   PROT 6.4*  --   ALBUMIN 3.3* 2.9*   No results for input(s): LIPASE, AMYLASE in the last 168 hours. No  results for input(s): AMMONIA in the last 168 hours. CBC:  Recent Labs Lab 02/27/15 1218  02/28/15 0206 02/28/15 0800 02/28/15 1439 03/01/15 0305 03/02/15 0401  WBC 10.5  --  9.4 8.5  --  8.9 7.7  NEUTROABS 9.2*  --   --   --   --   --   --   HGB 11.5*  < > 7.9* 7.5* 8.6* 8.2* 7.2*  HCT 34.3*  < > 22.6* 22.3* 25.7* 24.5* 21.7*  MCV 99.7  --  99.1 99.6  --  96.1 98.2  PLT 173  --  117* 109*  --  112* 103*  < > = values in this interval not displayed. Cardiac Enzymes: No results for input(s): CKTOTAL, CKMB, CKMBINDEX, TROPONINI in the last 168 hours. BNP (last 3 results)  Recent Labs  08/11/14 1035  BNP 320.9*    ProBNP (last 3 results) No results for input(s): PROBNP in the last 8760 hours.  CBG: No results for input(s): GLUCAP in the last 168 hours.  Recent Results (from the past 240 hour(s))  Surgical pcr screen     Status: None   Collection Time: 02/27/15  5:11 PM  Result Value Ref Range Status   MRSA, PCR NEGATIVE NEGATIVE Final   Staphylococcus aureus NEGATIVE NEGATIVE Final    Comment:        The Xpert SA Assay (FDA approved for NASAL specimens in patients over 69 years of age), is one component of a comprehensive surveillance program.  Test performance has been validated by [facility] for patients greater than or equal to 13 year old. It is not intended to diagnose infection nor to guide or monitor treatment.      Studies: No results found.  Scheduled Meds:  Scheduled Meds: . aspirin EC  325 mg Oral Daily  . donepezil  10 mg Oral QHS  . dutasteride  0.5 mg Oral QHS  . enoxaparin (LOVENOX) injection  30 mg Subcutaneous Q24H  . memantine  10 mg Oral BID  . sertraline  25 mg Oral Daily  . tamsulosin  0.4 mg Oral QHS   Continuous Infusions: . sodium chloride 75 mL/hr at 03/02/15  0700    Time spent on care of this patient: 35 min   Francis Thomas, Francis Dolecki, MD 03/02/2015, 9:50 AM  LOS: 3 days   [facility] Office  [phone] Pager -  Text Page per [email or web address] If 7PM-7AM, please contact night-coverage [email or web address]

## 2015-03-03 DIAGNOSIS — S72009S Fracture of unspecified part of neck of unspecified femur, sequela: Secondary | ICD-10-CM

## 2015-03-03 LAB — TYPE AND SCREEN
ABO/RH(D): A NEG
ANTIBODY SCREEN: NEGATIVE
UNIT DIVISION: 0
Unit division: 0

## 2015-03-03 MED ORDER — SENNOSIDES-DOCUSATE SODIUM 8.6-50 MG PO TABS: 1.0000 | ORAL_TABLET | Freq: Every evening | ORAL | Status: AC | PRN

## 2015-03-03 NOTE — Care Management Note (Signed)
Case Management Note  Patient Details  Name: Francis Thomas MRN: [id number] Date of Birth: 07/14/1924  Subjective/Objective:   intramedullary implant placement on 11/6                 Action/Plan: NCM spoke to pt and gave permission to speak to wife, [name] # [phone]. Scheduled to dc to SNF -rehab, [facility].   Expected Discharge Date:  03/04/2015               Expected Discharge Plan:  Skilled Nursing Facility  In-House Referral:  Clinical Social Work  Discharge planning Services  CM Consult  Status of Service:  Completed, signed off  Medicare Important Message Given:  Yes-second notification given Date Medicare IM Given:    Medicare IM give by:    Date Additional Medicare IM Given:    Additional Medicare Important Message give by:     If discussed at Long Length of Stay Meetings, dates discussed:    Additional Comments:  Elliot CousinShavis, Francoise Chojnowski Ellen, RN 03/03/2015, 11:19 AM

## 2015-03-03 NOTE — Progress Notes (Signed)
   Subjective:  Patient is comfortable  Objective:   VITALS:   Filed Vitals:   03/02/15 1511 03/02/15 2010 03/03/15 0100 03/03/15 0500  BP: 137/57 142/64 130/56 152/69  Pulse: 67 86 71 70  Temp: 98 F (36.7 C) 98.9 F (37.2 C) 99 F (37.2 C) 98.4 F (36.9 C)  TempSrc: Oral Oral Oral Oral  Resp: 19 18 18 20   Height:      Weight:    68.4 kg (150 lb 12.7 oz)  SpO2: 100% 100% 100% 98%    Neurovascular intact Sensation intact distally Intact pulses distally Dorsiflexion/Plantar flexion intact Incision: dressing C/D/I and no drainage No cellulitis present Compartment soft   Lab Results  Component Value Date   WBC 7.7 03/02/2015   HGB 9.1* 03/02/2015   HCT 26.6* 03/02/2015   MCV 98.2 03/02/2015   PLT 103* 03/02/2015     Assessment/Plan:  4 Days Post-Op   - stable from ortho stand point for dc to SNF - f/u 2 weeks in office  Cheral AlmasXu, Naiping Michael 03/03/2015, 7:37 AM [phone]

## 2015-03-03 NOTE — NC FL2 (Signed)
[place] MEDICAID FL2 LEVEL OF CARE SCREENING TOOL     IDENTIFICATION  Patient Name: Francis Thomas Birthdate: 20-Apr-1925 Sex: male Admission Date (Current Location): 02/27/2015  [facility] and [place] Number:     Facility and Address:  [facility], [address] [place], [place] [address]      Provider Number: [id number]  Attending Physician Name and Address:  Penny Pia, MD  Relative Name and Phone Number:       Current Level of Care: Hospital Recommended Level of Care: Skilled Nursing Facility Prior Approval Number:    Date Approved/Denied:   PASRR Number: [id number]   [facility] [phone])  Discharge Plan: SNF    Current Diagnoses: Patient Active Problem List   Diagnosis Date Noted  . Intertrochanteric fracture of right hip (HCC) 02/28/2015  . Abnormal QT interval present on electrocardiogram 02/27/2015  . Pleural effusion 08/11/2014  . Elevated serum creatinine 08/11/2014  . Dementia 08/11/2014  . TIA (transient ischemic attack) 08/11/2014  . Elevated INR 08/11/2014  . Dyspnea on exertion 08/11/2014  . Bilateral carotid artery disease (HCC)   . Atrial fibrillation (HCC)   . Benign positional vertigo     Orientation ACTIVITIES/SOCIAL BLADDER RESPIRATION    Self  Family supportive Incontinent Normal  BEHAVIORAL SYMPTOMS/MOOD NEUROLOGICAL BOWEL NUTRITION STATUS      Continent Diet (Reg )  PHYSICIAN VISITS COMMUNICATION OF NEEDS Height & Weight Skin    Verbally  (170.2 cm) 129 lbs. Skin abrasions          AMBULATORY STATUS RESPIRATION    Assist extensive Normal      Personal Care Assistance Level of Assistance  Bathing, Dressing Bathing Assistance: Maximum assistance   Dressing Assistance: Maximum assistance      Functional Limitations Info                SPECIAL CARE FACTORS FREQUENCY  PT (By licensed PT), OT (By licensed OT)                   Additional Factors Info  Code Status, Allergies Code  Status Info: Full Code  Allergies Info: Penicillin G Benzathine, Scopolamine Base, Skelaxin, Sulfa Antibiotics           Current Medications (03/03/2015): Current Facility-Administered Medications  Medication Dose Route Frequency Provider Last Rate Last Dose  . 0.9 %  sodium chloride infusion   Intravenous Continuous Calvert Cantor, MD 75 mL/hr at 03/03/15 0214    . acetaminophen (TYLENOL) tablet 650 mg  650 mg Oral Q6H PRN Naiping Donnelly Stager, MD       Or  . acetaminophen (TYLENOL) suppository 650 mg  650 mg Rectal Q6H PRN Naiping Donnelly Stager, MD      . alum & mag hydroxide-simeth (MAALOX/MYLANTA) 200-200-20 MG/5ML suspension 30 mL  30 mL Oral Q4H PRN Tarry Kos, MD      . aspirin EC tablet 325 mg  325 mg Oral Daily Naiping Donnelly Stager, MD   325 mg at 03/02/15 0935  . donepezil (ARICEPT) tablet 10 mg  10 mg Oral QHS Meredeth Ide, MD   10 mg at 03/02/15 2140  . dutasteride (AVODART) capsule 0.5 mg  0.5 mg Oral QHS Meredeth Ide, MD   0.5 mg at 03/02/15 2140  . enoxaparin (LOVENOX) injection 30 mg  30 mg Subcutaneous Q24H Calvert Cantor, MD   30 mg at 03/02/15 1731  . HYDROcodone-acetaminophen (NORCO/VICODIN) 5-325 MG per tablet 1-2 tablet  1-2 tablet  Oral Q6H PRN Tarry Kos, MD   1 tablet at 03/02/15 2358  . memantine (NAMENDA) tablet 10 mg  10 mg Oral BID Meredeth Ide, MD   10 mg at 03/02/15 2140  . menthol-cetylpyridinium (CEPACOL) lozenge 3 mg  1 lozenge Oral PRN Naiping Donnelly Stager, MD       Or  . phenol (CHLORASEPTIC) mouth spray 1 spray  1 spray Mouth/Throat PRN Naiping Donnelly Stager, MD      . methocarbamol (ROBAXIN) tablet 500 mg  500 mg Oral Q6H PRN Naiping Donnelly Stager, MD       Or  . methocarbamol (ROBAXIN) 500 mg in dextrose 5 % 50 mL IVPB  500 mg Intravenous Q6H PRN Tarry Kos, MD   500 mg at 02/28/15 0124  . morphine 2 MG/ML injection 0.5 mg  0.5 mg Intravenous Q2H PRN Tarry Kos, MD   0.5 mg at 02/28/15 0023  . ondansetron (ZOFRAN) tablet 4 mg  4 mg Oral Q6H PRN Naiping Donnelly Stager, MD       Or  . ondansetron [facility])  injection 4 mg  4 mg Intravenous Q6H PRN Naiping Donnelly Stager, MD      . oxyCODONE (Oxy IR/ROXICODONE) immediate release tablet 5-10 mg  5-10 mg Oral Q4H PRN Naiping Donnelly Stager, MD      . senna-docusate (Senokot-S) tablet 1 tablet  1 tablet Oral QHS PRN Meredeth Ide, MD      . sertraline (ZOLOFT) tablet 25 mg  25 mg Oral Daily Meredeth Ide, MD   25 mg at 03/02/15 0935  . tamsulosin (FLOMAX) capsule 0.4 mg  0.4 mg Oral QHS Meredeth Ide, MD   0.4 mg at 03/02/15 2140   Do not use this list as official medication orders. Please verify with discharge summary.  Discharge Medications:   Medication List    STOP taking these medications        aspirin 325 MG tablet  Replaced by:  aspirin EC 325 MG tablet      TAKE these medications        aspirin EC 325 MG tablet  Take 1 tablet (325 mg total) by mouth daily.     oxyCODONE-acetaminophen 5-325 MG tablet  Commonly known as:  PERCOCET  Take 1-2 tablets by mouth every 4 (four) hours as needed for severe pain.      ASK your doctor about these medications        acetaminophen 325 MG tablet  Commonly known as:  TYLENOL  Take 2 tablets (650 mg total) by mouth every 6 (six) hours as needed for moderate pain (headache).     donepezil 10 MG tablet  Commonly known as:  ARICEPT  Take 10 mg by mouth at bedtime.     dutasteride 0.5 MG capsule  Commonly known as:  AVODART  Take 0.5 mg by mouth at bedtime.     feeding supplement (ENSURE ENLIVE) Liqd  Take 237 mLs by mouth 2 (two) times daily between meals.     furosemide 40 MG tablet  Commonly known as:  LASIX  Take 40 mg by mouth every morning.     Melatonin 5 MG Tabs  Take 5 mg by mouth at bedtime.     memantine 10 MG tablet  Commonly known as:  NAMENDA  Take 10 mg by mouth 2 (two) times daily.     sertraline 25 MG tablet  Commonly known as:  ZOLOFT  Take 25 mg by mouth  daily.     tamsulosin 0.4 MG Caps capsule  Commonly known as:  FLOMAX  Take 0.4 mg by mouth at bedtime.        Relevant  Imaging Results:  Relevant Lab Results:  Recent Labs    Additional Information    Rojelio BrennerVaughn, Cassity Christian S, LCSW [phone]

## 2015-03-03 NOTE — Progress Notes (Signed)
Notified by [facility] tel of a 2.23 second pause. pt baseline a-fib. will continue to monitor. [facility] paged and made aware.

## 2015-03-03 NOTE — Discharge Planning (Signed)
[facility]. Patient's family updated at bedside by RN.  [facility] report number: [phone] Transportation: EMS  Marcelline Deistmily Nadie [place], [place] [phone] Orthopedics: [phone] Surgical: [phone]

## 2015-03-03 NOTE — Progress Notes (Signed)
Patient was d/c with a condom cath in place, since he has been incontinent of urine.

## 2015-03-03 NOTE — Clinical Social Work Placement (Signed)
   CLINICAL SOCIAL WORK PLACEMENT  NOTE  Date:  03/03/2015  Patient Details  Name: Francis Thomas MRN: [id number] Date of Birth: 06/08/1924  Clinical Social Work is seeking post-discharge placement for this patient at the Skilled  Nursing Facility level of care (*CSW will initial, date and re-position this form in  chart as items are completed):  Yes   Patient/family provided with [facility] Clinical Social Work Department's list of facilities offering this level of care within the geographic area requested by the patient (or if unable, by the patient's family).  Yes   Patient/family informed of their freedom to choose among providers that offer the needed level of care, that participate in Medicare, Medicaid or managed care program needed by the patient, have an available bed and are willing to accept the patient.  Yes   Patient/family informed of [facility]'s ownership interest in [facility] and [facility], as well as of the fact that they are under no obligation to receive care at these facilities.  PASRR submitted to EDS on       PASRR number received on       Existing PASRR number confirmed on 03/01/15     FL2 transmitted to all facilities in geographic area requested by pt/family on 03/01/15     FL2 transmitted to all facilities within larger geographic area on       Patient informed that his/her managed care company has contracts with or will negotiate with certain facilities, including the following:        Yes   Patient/family informed of bed offers received.  Patient chooses bed at  [facility]([place] [facility]     Physician recommends and patient chooses bed at      Patient to be transferred to  [facility] [facility] on 03/03/15.  Patient to be transferred to facility by [facility]     Patient family notified on 03/03/15 of transfer.  Name of family member notified:  Patient and family at bedside.     PHYSICIAN       Additional Comment:     _______________________________________________ Rod MaeVaughn, Saturnino Liew S, LCSW 03/03/2015, 1:57 PM

## 2015-03-03 NOTE — Discharge Summary (Signed)
Physician Discharge Summary  Francis Thomas ZOX:[id number] DOB: 04/25/1924 DOA: 02/27/2015  PCP: Francis Thomas,Francis THOMAS, MD  Admit date: 02/27/2015 Discharge date: 03/03/2015  Time spent: > 35 minutes  Recommendations for Outpatient Follow-up:  1. Please decide when to continue patient's lasix. Was held due to ARF.  2. Please obtain repeat BMP within the next 1 week. 3. Pt will need to follow up with his orthopaedic surgeon in 2 wks post d/c  Discharge Diagnoses:  Principal Problem:   Intertrochanteric fracture of right hip [facility](HCC) Active Problems:   Dementia   Atrial fibrillation (HCC)   Abnormal QT interval present on electrocardiogram   Discharge Condition: stable  Diet recommendation: regular diet  Filed Weights   03/01/15 2115 03/02/15 0400 03/03/15 0500  Weight: 65.2 kg (143 lb 11.8 oz) 65.1 kg (143 lb 8.3 oz) 68.4 kg (150 lb 12.7 oz)    History of present illness:  Francis Thomas is a 81 y.o. male with PMH of A-fib on aspirin, Gibert's Thomas, TIAs and dementia who presents after a fall at home and is found to have a right hip fracture. He underwent and intramedullary implant placement on 11/6.  Hospital Course:  Right hip fracture - ortho consulted and managed while in house. - aspirin for dvt prophylaxis  ARF - held lasix due to elevated serum creatinine. Pcp to decide when to continue - improved with fluids - Recommend reassessing BMP within 1 wk after d/c  Anemia - Patient had acute blood loss anemia 2ary to procedure. - reassess cbc within 1 wk after hospital discharge  History of Afib - currently rate controlled off of rate controlling medication - On Aspirin given history of falls for anticoagulation  Procedures: Treatment of pertrochanteric fracture with intramedullary implant  Consultations:  Ortho: Dr. Roda Thomas  Discharge Exam: Filed Vitals:   03/03/15 0500  BP: 152/69  Pulse: 70  Temp: 98.4 F (36.9 C)  Resp: 20    General: Pt in nad, alert and  awake Cardiovascular: s1 and s2 WNL, no mrg Respiratory: no increased wob, no wheezes, equal chest rise.  Discharge Instructions   Discharge Instructions    Call MD for:  difficulty breathing, headache or visual disturbances    Complete by:  As directed      Call MD for:  extreme fatigue    Complete by:  As directed      Call MD for:  temperature >100.4    Complete by:  As directed      Diet - low sodium heart healthy    Complete by:  As directed      Discharge instructions    Complete by:  As directed   Patient will need to follow up with his Orthopaedic surgeon 2 weeks after d/c.  On ASA for DVT prophylaxis     Increase activity slowly    Complete by:  As directed      Touch down weight bearing    Complete by:  As directed           Current Discharge Medication List    START taking these medications   Details  aspirin EC 325 MG tablet Take 1 tablet (325 mg total) by mouth daily. Qty: 84 tablet, Refills: 0    oxyCODONE-acetaminophen (PERCOCET) 5-325 MG tablet Take 1-2 tablets by mouth every 4 (four) hours as needed for severe pain. Qty: 90 tablet, Refills: 0    senna-docusate (SENOKOT-S) 8.6-50 MG tablet Take 1 tablet by mouth at bedtime as needed  for mild constipation. Qty: 30 tablet, Refills: 0      CONTINUE these medications which have NOT CHANGED   Details  acetaminophen (TYLENOL) 325 MG tablet Take 2 tablets (650 mg total) by mouth every 6 (six) hours as needed for moderate pain (headache).    donepezil (ARICEPT) 10 MG tablet Take 10 mg by mouth at bedtime.    dutasteride (AVODART) 0.5 MG capsule Take 0.5 mg by mouth at bedtime.     Melatonin 5 MG TABS Take 5 mg by mouth at bedtime.    memantine (NAMENDA) 10 MG tablet Take 10 mg by mouth 2 (two) times daily.    sertraline (ZOLOFT) 25 MG tablet Take 25 mg by mouth daily.    tamsulosin (FLOMAX) 0.4 MG CAPS capsule Take 0.4 mg by mouth at bedtime.    feeding supplement, ENSURE ENLIVE, (ENSURE ENLIVE) LIQD  Take 237 mLs by mouth 2 (two) times daily between meals. Qty: 237 mL, Refills: 12      STOP taking these medications     aspirin 325 MG tablet      furosemide (LASIX) 40 MG tablet        Allergies  Allergen Reactions  . Penicillin G Benzathine     Has patient had a PCN reaction causing immediate rash, facial/tongue/throat swelling, SOB or lightheadedness with hypotension: unknown Has patient had a PCN reaction causing severe rash involving mucus membranes or skin necrosis: unknwon Has patient had a PCN reaction that required hospitalization unknown Has patient had a PCN reaction occurring within the last 10 years: no If all of the above answers are "NO", then may proceed with Cephalosporin use.   Francis Thomas Kitchen Scopolamine Base [Scopolamine]     unknown  . Skelaxin [Metaxalone]     Confusion  . Sulfa Antibiotics     unknown   Follow-up Information    Follow up with Francis Almas, MD In 2 weeks.   Specialty:  Orthopedic Surgery   Why:  For suture removal, For wound re-check   Contact information:   [address] [place] Sa[place] [address] [phone]        The results of significant diagnostics from this hospitalization (including imaging, microbiology, ancillary and laboratory) are listed below for reference.    Significant Diagnostic Studies: Ct Head Wo Contrast  02/27/2015  CLINICAL DATA:  Post fall EXAM: CT HEAD WITHOUT CONTRAST CT CERVICAL SPINE WITHOUT CONTRAST TECHNIQUE: Multidetector CT imaging of the head and cervical spine was performed following the standard protocol without intravenous contrast. Multiplanar CT image reconstructions of the cervical spine were also generated. COMPARISON:  Head CT - 09/19/2010 ; brain MRI - 05/18/2010 FINDINGS: CT HEAD FINDINGS Regional soft tissues appear normal. No radiopaque foreign body. No displaced calvarial fracture. Similar findings of advanced atrophy with sulcal prominence and centralized volume loss with commensurate ex  vacuo dilatation of the ventricular system, asymmetrically involving the occipital horn of left lateral ventricle, unchanged. Scattered periventricular hypodensities compatible microvascular ischemic disease. Given extensive background parenchymal abnormalities, there is no CT evidence of superimposed acute large territory infarct. No intraparenchymal or extra-axial mass or hemorrhage. Unchanged configuration of ventricles and basilar cisterns. No midline shift. Intracranial atherosclerosis. Limited visualization the paranasal sinuses and mastoid air cells is normal. No air-fluid levels. Post bilateral cataract surgery. CT CERVICAL SPINE FINDINGS C1 to the superior endplate of T3 is imaged. Accentuated cervical lordosis without anterolisthesis or retrolisthesis. The bilateral facets are normally aligned. The dens is normally positioned between the lateral masses of C1.  Moderate degenerative change of the atlantodental articulation. Normal atlantoaxial articulations. No fracture or static subluxation of the cervical spine. Cervical vertebral body heights are preserved. Prevertebral soft tissues are normal. Mild to moderate multilevel cervical spine DDD, worse at C5-C6 and to a lesser extent, C3-C4 and C6-C7 with disc space height loss, endplate irregularity and sclerosis. Atherosclerotic plaque within the bilateral carotid bulbs. No bulky cervical lymphadenopathy on this noncontrast examination. Limited visualization of the lung apices is normal. IMPRESSION: 1. Similar findings of advanced atrophy and microvascular ischemic disease without acute intracranial process. 2. No fracture or static subluxation of the cervical spine. 3. Accentuated cervical lordosis without anterolisthesis. 4. Mild-to-moderate multilevel cervical spine DDD, worse at C5-C6. 5. Atherosclerotic plaque within the bilateral carotid bulbs. Further evaluation with nonemergent carotid Doppler ultrasound could be performed as clinically indicated.  Electronically Signed   By: Simonne Come M.D.   On: 02/27/2015 13:43   Ct Cervical Spine Wo Contrast  02/27/2015  CLINICAL DATA:  Post fall EXAM: CT HEAD WITHOUT CONTRAST CT CERVICAL SPINE WITHOUT CONTRAST TECHNIQUE: Multidetector CT imaging of the head and cervical spine was performed following the standard protocol without intravenous contrast. Multiplanar CT image reconstructions of the cervical spine were also generated. COMPARISON:  Head CT - 09/19/2010 ; brain MRI - 05/18/2010 FINDINGS: CT HEAD FINDINGS Regional soft tissues appear normal. No radiopaque foreign body. No displaced calvarial fracture. Similar findings of advanced atrophy with sulcal prominence and centralized volume loss with commensurate ex vacuo dilatation of the ventricular system, asymmetrically involving the occipital horn of left lateral ventricle, unchanged. Scattered periventricular hypodensities compatible microvascular ischemic disease. Given extensive background parenchymal abnormalities, there is no CT evidence of superimposed acute large territory infarct. No intraparenchymal or extra-axial mass or hemorrhage. Unchanged configuration of ventricles and basilar cisterns. No midline shift. Intracranial atherosclerosis. Limited visualization the paranasal sinuses and mastoid air cells is normal. No air-fluid levels. Post bilateral cataract surgery. CT CERVICAL SPINE FINDINGS C1 to the superior endplate of T3 is imaged. Accentuated cervical lordosis without anterolisthesis or retrolisthesis. The bilateral facets are normally aligned. The dens is normally positioned between the lateral masses of C1. Moderate degenerative change of the atlantodental articulation. Normal atlantoaxial articulations. No fracture or static subluxation of the cervical spine. Cervical vertebral body heights are preserved. Prevertebral soft tissues are normal. Mild to moderate multilevel cervical spine DDD, worse at C5-C6 and to a lesser extent, C3-C4 and C6-C7  with disc space height loss, endplate irregularity and sclerosis. Atherosclerotic plaque within the bilateral carotid bulbs. No bulky cervical lymphadenopathy on this noncontrast examination. Limited visualization of the lung apices is normal. IMPRESSION: 1. Similar findings of advanced atrophy and microvascular ischemic disease without acute intracranial process. 2. No fracture or static subluxation of the cervical spine. 3. Accentuated cervical lordosis without anterolisthesis. 4. Mild-to-moderate multilevel cervical spine DDD, worse at C5-C6. 5. Atherosclerotic plaque within the bilateral carotid bulbs. Further evaluation with nonemergent carotid Doppler ultrasound could be performed as clinically indicated. Electronically Signed   By: Simonne Come M.D.   On: 02/27/2015 13:43   Dg Op Swallowing Func-medicare/speech Path  02/14/2015  CLINICAL DATA:  Dysphagia EXAM: MODIFIED BARIUM SWALLOW TECHNIQUE: Different consistencies of barium were administered orally to the patient by the Speech Pathologist. Imaging of the pharynx was performed in the lateral projection. FLUOROSCOPY TIME:  If the device does not provide the exposure index: Fluoroscopy Time:  1 minutes, 37 seconds Number of Acquired Images:  1 COMPARISON:  None. FINDINGS: Thin liquid- within normal limits.  One episode of penetration which was recovered with cough. No frank aspiration event identified. Pharyngeal motility otherwise normal on all swallows that followed. Nectar thick liquid- within normal limits Honey- within normal limits Pure- within normal limits Cracker-within normal limits Pure with cracker- within normal limits Barium tablet - barium tablet failed to pass pass the gastroesophageal junction in a timely fashion suggesting stricture or spasm. IMPRESSION: 1. No aspiration events. 2. Barium tablet failed to pass the gastroesophageal junction in a timely fashion suggesting stricture or spasm. At least mild secondary esophageal dysmotility  noted subsequently with to and fro flow of contrast within the mid esophagus. Would consider further characterization with complete upper GI fluoroscopic examination if clinically needed. Please refer to the Speech Pathologists report for complete details and recommendations. Electronically Signed   By: Bary Richard M.D.   On: 02/14/2015 13:54   [name]  02/27/2015  CLINICAL DATA:  Larey Seat today. EXAM: PORTABLE CHEST 1 VIEW COMPARISON:  Multiple prior chest x-rays and a chest CT from 08/13/2014 FINDINGS: Examination is limited by severe rotation of the patient. The heart is grossly normal in size and there is tortuosity, ectasia and calcification of the thoracic aorta. Chronic severe right-sided pleural thickening and/or pleural effusion with overlying atelectasis and/or scarring. The left lung is clear. Six, seventh and eighth right rib fractures are noted but these appear to be chronic based on the prior CT scan. IMPRESSION: Chronic right pleural effusion, pleural thickening and overlying atelectasis or scarring. The left lung is clear. Remote healed right rib fractures. Electronically Signed   By: Rudie Meyer M.D.   On: 02/27/2015 13:25   Dg C-arm 1-60 Min  02/27/2015  CLINICAL DATA:  ORIF of intertrochanteric right femur fracture EXAM: DG C-ARM 61-120 MIN; RIGHT FEMUR 2 VIEWS COMPARISON:  Right femur radiographs from earlier today FINDINGS: Fluoroscopy time 1 minutes 49.5 seconds. 5 spot fluoroscopic nondiagnostic intraoperative radiographs of the right femur demonstrate intra medullary rod with proximal interlocking right femoral neck pains and distal interlocking screw transfixing intratrochanteric right proximal femur fracture. IMPRESSION: Intraoperative fluoroscopic guidance for ORIF right femur intratrochanteric fracture. Electronically Signed   By: Delbert Phenix M.D.   On: 02/27/2015 19:28   Dg Hip Unilat With Pelvis 2-3 Views Right  02/27/2015  CLINICAL DATA:  Right hip pain after  falling on the floor at home today. EXAM: DG HIP (WITH OR WITHOUT PELVIS) 2-3V RIGHT COMPARISON:  None. FINDINGS: Comminuted right intertrochanteric and subtrochanteric fracture with varus angulation. Diffuse osteopenia. IMPRESSION: Comminuted right intertrochanteric and subtrochanteric fracture. Electronically Signed   By: Beckie Salts M.D.   On: 02/27/2015 12:29   Dg Femur, Min 2 Views Right  02/27/2015  CLINICAL DATA:  ORIF of intertrochanteric right femur fracture EXAM: DG C-ARM 61-120 MIN; RIGHT FEMUR 2 VIEWS COMPARISON:  Right femur radiographs from earlier today FINDINGS: Fluoroscopy time 1 minutes 49.5 seconds. 5 spot fluoroscopic nondiagnostic intraoperative radiographs of the right femur demonstrate intra medullary rod with proximal interlocking right femoral neck pains and distal interlocking screw transfixing intratrochanteric right proximal femur fracture. IMPRESSION: Intraoperative fluoroscopic guidance for ORIF right femur intratrochanteric fracture. Electronically Signed   By: Delbert Phenix M.D.   On: 02/27/2015 19:28   Dg Femur Port, Min 2 Views Right  02/27/2015  CLINICAL DATA:  Unwitnessed fall at home today.  Right hip pain. EXAM: RIGHT FEMUR PORTABLE 1 VIEW COMPARISON:  None. FINDINGS: There is a severely comminuted intertrochanteric fracture of the proximal right femur with angulation and  some over riding. There is severe osteopenia. Distal femur is intact. IMPRESSION: Comminuted angulated over riding intertrochanteric fracture of the proximal right femur. Electronically Signed   By: Francene Boyers M.D.   On: 02/27/2015 13:45    Microbiology: Recent Results (from the past 240 hour(s))  Surgical pcr screen     Status: None   Collection Time: 02/27/15  5:11 PM  Result Value Ref Range Status   MRSA, PCR NEGATIVE NEGATIVE Final   Staphylococcus aureus NEGATIVE NEGATIVE Final    Comment:        The Xpert SA Assay (FDA approved for NASAL specimens in patients over 21 years of  age), is one component of a comprehensive surveillance program.  Test performance has been validated by [facility] for patients greater than or equal to 77 year old. It is not intended to diagnose infection nor to guide or monitor treatment.      Labs: Basic Metabolic Panel:  Recent Labs Lab 02/27/15 1218 02/27/15 1237 02/27/15 1821 02/28/15 0206 02/28/15 1058 03/01/15 0305  NA 136 138 136 136  --  135  K 4.6 4.4 4.2 4.7  --  4.6  CL 102 100*  --  102  --  102  CO2 28  --   --  25  --  25  GLUCOSE 137* 130*  --  174*  --  127*  BUN 29* 39*  --  34*  --  53*  CREATININE 1.09 1.00  --  1.54*  --  2.11*  CALCIUM 8.5*  --   --  8.6*  --  8.4*  MG 2.1  --   --   --  2.1  --    Liver Function Tests:  Recent Labs Lab 02/27/15 1218 02/28/15 1058  AST 33  --   ALT 16*  --   ALKPHOS 103  --   BILITOT 1.6*  --   PROT 6.4*  --   ALBUMIN 3.3* 2.9*   No results for input(s): LIPASE, AMYLASE in the last 168 hours. No results for input(s): AMMONIA in the last 168 hours. CBC:  Recent Labs Lab 02/27/15 1218  02/28/15 0206 02/28/15 0800 02/28/15 1439 03/01/15 0305 03/02/15 0401 03/02/15 1636  WBC 10.5  --  9.4 8.5  --  8.9 7.7  --   NEUTROABS 9.2*  --   --   --   --   --   --   --   HGB 11.5*  < > 7.9* 7.5* 8.6* 8.2* 7.2* 9.1*  HCT 34.3*  < > 22.6* 22.3* 25.7* 24.5* 21.7* 26.6*  MCV 99.7  --  99.1 99.6  --  96.1 98.2  --   PLT 173  --  117* 109*  --  112* 103*  --   < > = values in this interval not displayed. Cardiac Enzymes: No results for input(s): CKTOTAL, CKMB, CKMBINDEX, TROPONINI in the last 168 hours. BNP: BNP (last 3 results)  Recent Labs  08/11/14 1035  BNP 320.9*    ProBNP (last 3 results) No results for input(s): PROBNP in the last 8760 hours.  CBG: No results for input(s): GLUCAP in the last 168 hours.   Signed:  Penny Pia  [facility] 03/03/2015, 12:57 PM

## 2015-04-12 IMAGING — CR DG CHEST 2V
2 series · 2 of 2 positions shown · non-contrast
Comparison: 12/25/2013

CLINICAL DATA: Right pleural effusion.  Cough.

EXAM:
CHEST  2 VIEW

[w chest pa]
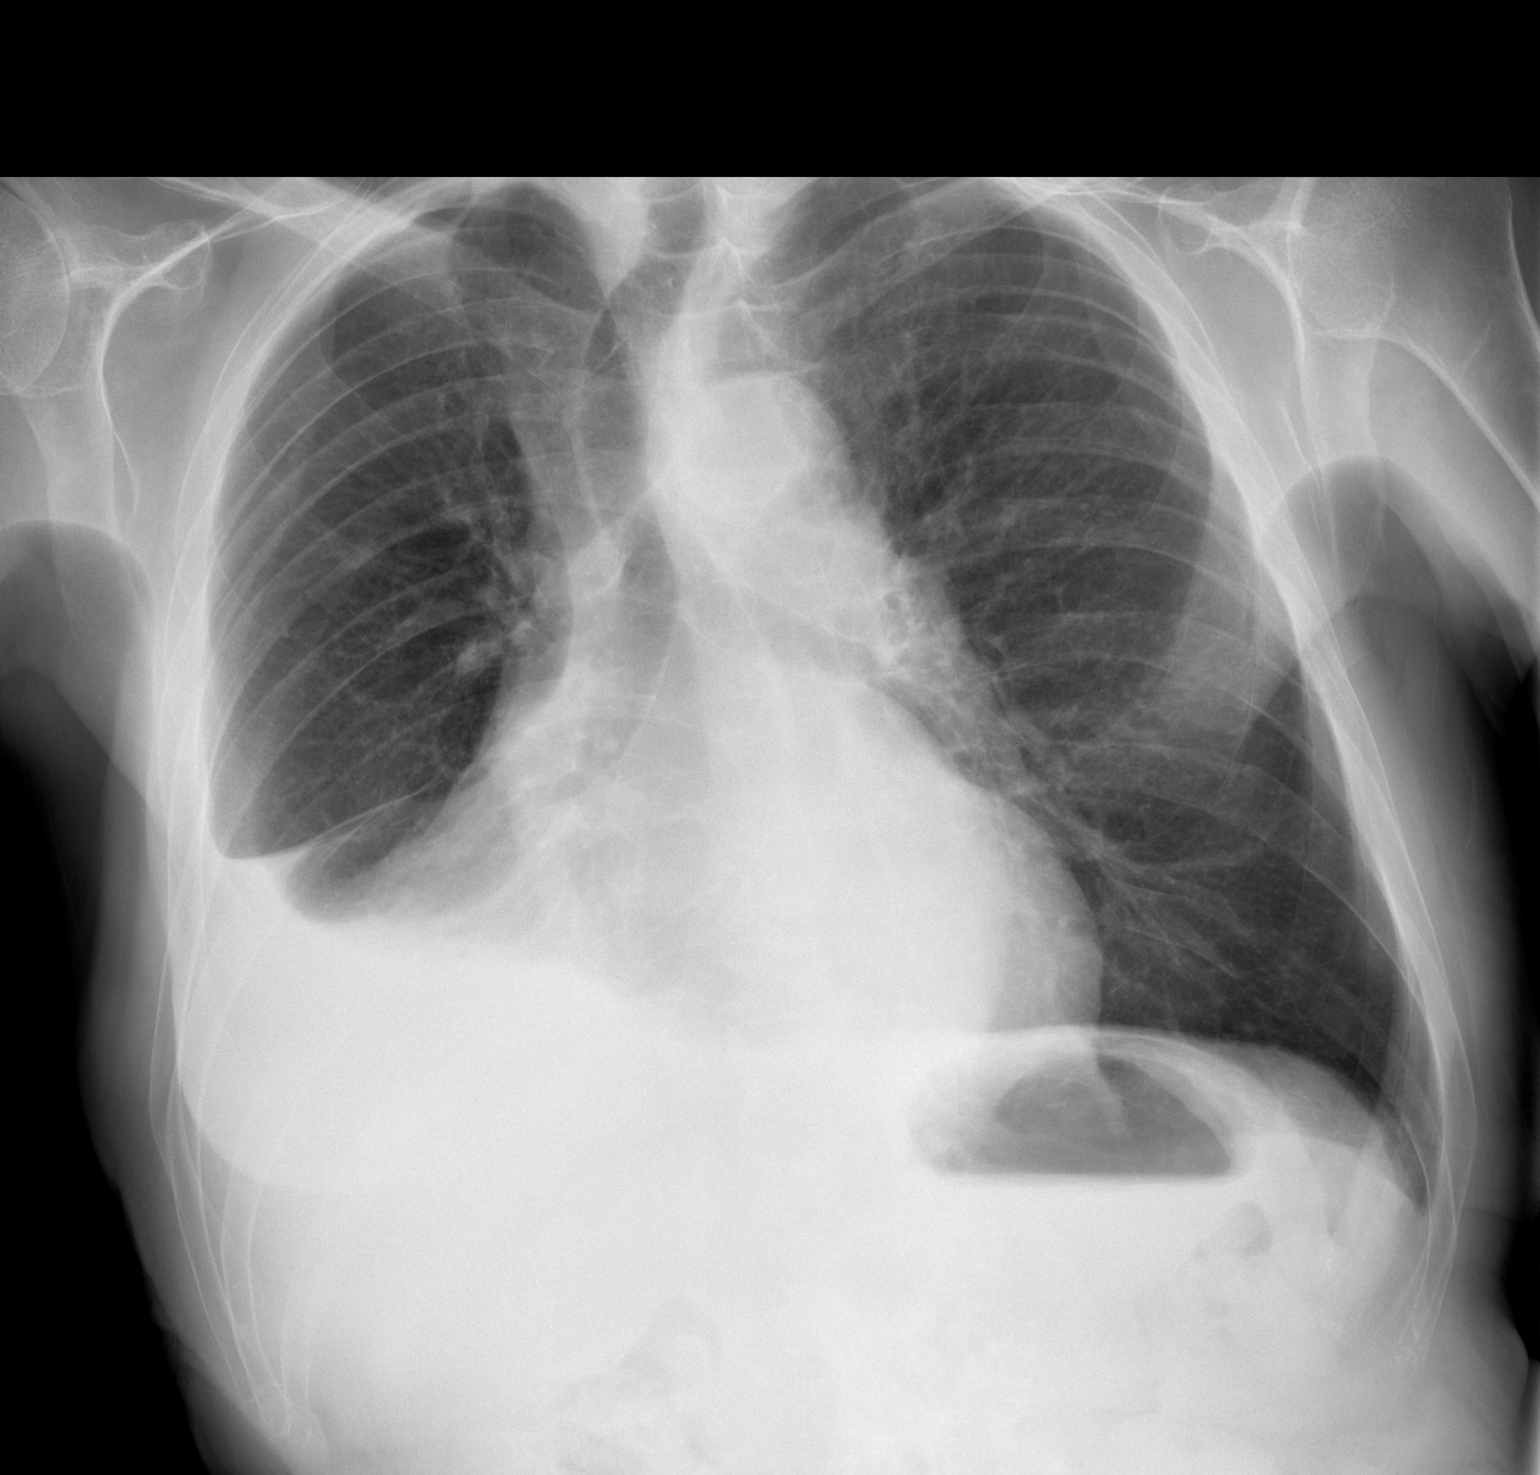

[w chest lat]
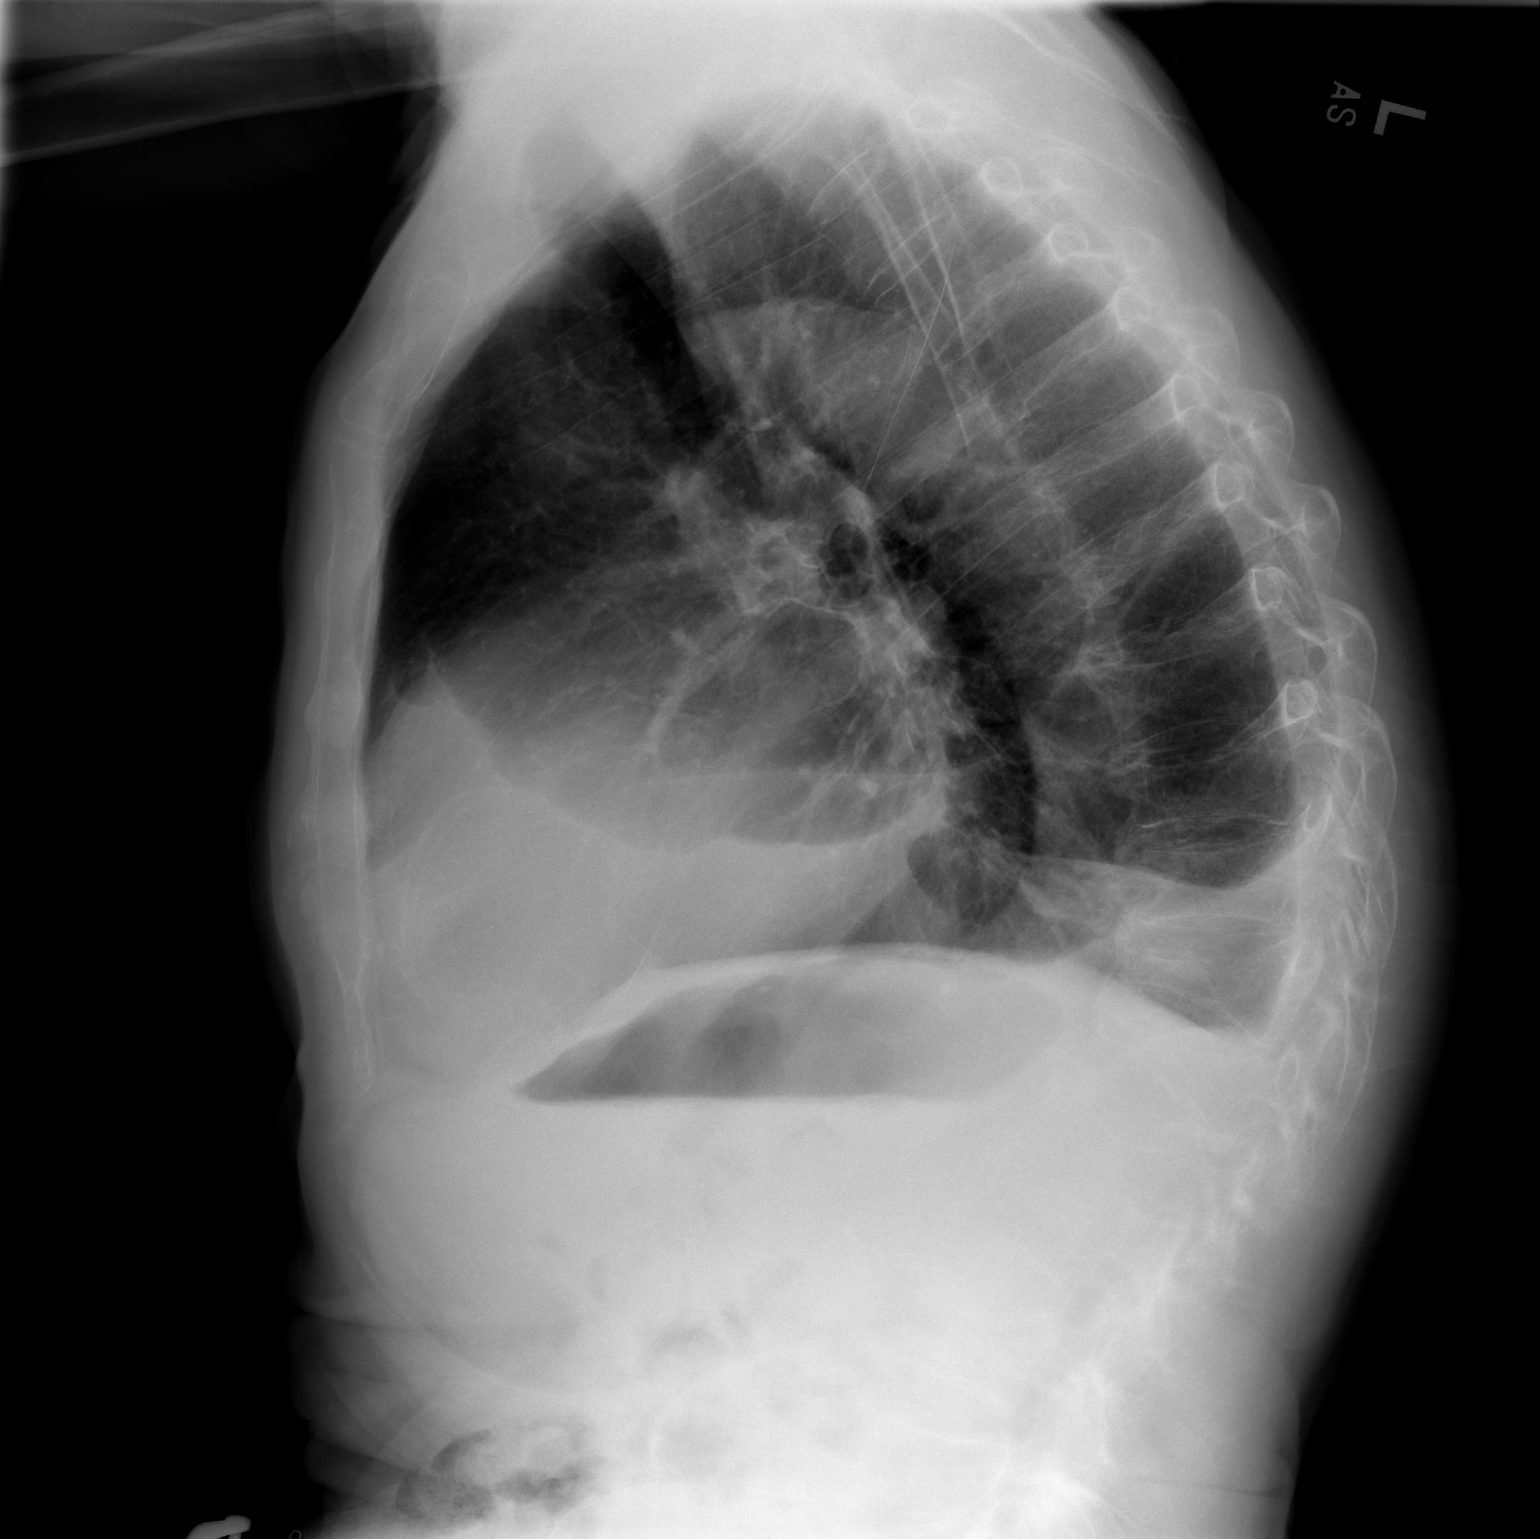

[2 of 2 positions shown; findings below may reference images not displayed]

FINDINGS: Small to moderate sized right pleural effusion has mildly decreased
in size from the prior study. There is associated right lung base
opacity consistent with atelectasis. Minimal left fusion, stable.

No evidence of pneumonia.  No pulmonary edema.

Cardiac silhouette is normal in size. No mediastinal or hilar
masses.

Bony thorax is demineralized. There stable compression fractures of
the lower thoracic and upper lumbar spine.
IMPRESSION: 1. Small moderate right pleural effusion, mildly decreased in size
from prior study. Minimal left pleural effusion, stable.
2. No convincing pneumonia.  No pulmonary edema.

## 2015-04-24 DEATH — deceased

## 2016-05-06 IMAGING — RF DG SWALLOWING FUNCTION
1 series · 1 of 1 positions shown · non-contrast
Comparison: None.

CLINICAL DATA: Dysphagia

EXAM:
MODIFIED BARIUM SWALLOW
TECHNIQUE: Different consistencies of barium were administered orally to the
patient by the Speech Pathologist. Imaging of the pharynx was
performed in the lateral projection.
FLUOROSCOPY TIME:  If the device does not provide the exposure
index:
Fluoroscopy Time:  1 minutes, 37 seconds
Number of Acquired Images:  1

[Series 1: run · 1 of 1 slices shown]
[im 1/1]
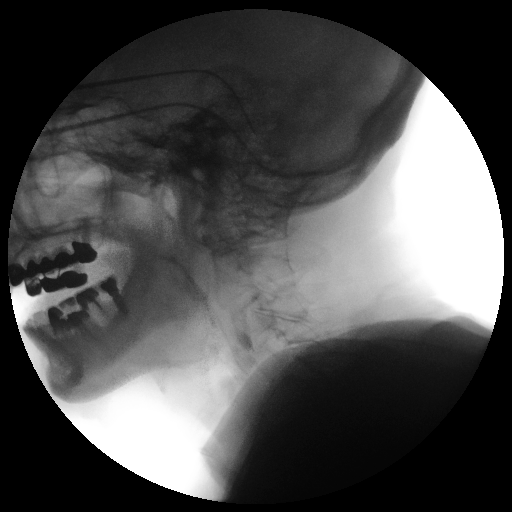

[1 of 1 positions shown; findings below may reference images not displayed]

FINDINGS: Thin liquid- within normal limits. One episode of penetration which
was recovered with cough. No frank aspiration event identified.
Pharyngeal motility otherwise normal on all swallows that followed.

Nectar thick liquid- within normal limits

Honey- within normal limits

Mecenas?Shalley within normal limits

Cracker-within normal limits

Mecenas?Illescas with cracker- within normal limits

Barium tablet - barium tablet failed to pass pass the
gastroesophageal junction in a timely fashion suggesting stricture
or spasm.
IMPRESSION: 1. No aspiration events.
2. Barium tablet failed to pass the gastroesophageal junction in a
timely fashion suggesting stricture or spasm. At least mild
secondary esophageal dysmotility noted subsequently with to and fro
flow of contrast within the mid esophagus. Would consider further
characterization with complete upper GI fluoroscopic examination if
clinically needed.

Please refer to the Speech Pathologists report for complete details
and recommendations.
